# Patient Record
Sex: Female | Born: 1965 | Race: White | Hispanic: No | Marital: Married | Smoking: Never smoker
Health system: Southern US, Community
[De-identification: ages and names within clinical notes are randomized; demographics above are authoritative.]

## PROBLEM LIST (undated history)

## (undated) DIAGNOSIS — G43909 Migraine, unspecified, not intractable, without status migrainosus: Secondary | ICD-10-CM

## (undated) DIAGNOSIS — K8050 Calculus of bile duct without cholangitis or cholecystitis without obstruction: Secondary | ICD-10-CM

## (undated) HISTORY — DX: Migraine, unspecified, not intractable, without status migrainosus: G43.909

---

## 1998-04-15 ENCOUNTER — Other Ambulatory Visit: Admission: RE | Admit: 1998-04-15 | Discharge: 1998-04-15 | Payer: Self-pay | Admitting: Obstetrics and Gynecology

## 1998-07-04 ENCOUNTER — Other Ambulatory Visit
Admission: RE | Admit: 1998-07-04 | Discharge: 1998-07-04 | Payer: Self-pay | Admitting: Physical Medicine & Rehabilitation

## 1999-04-05 ENCOUNTER — Other Ambulatory Visit: Admission: RE | Admit: 1999-04-05 | Discharge: 1999-04-05 | Payer: Self-pay | Admitting: Obstetrics and Gynecology

## 1999-05-26 ENCOUNTER — Inpatient Hospital Stay (HOSPITAL_COMMUNITY): Admission: AD | Admit: 1999-05-26 | Discharge: 1999-05-26 | Payer: Self-pay | Admitting: Obstetrics & Gynecology

## 1999-06-12 ENCOUNTER — Ambulatory Visit (HOSPITAL_COMMUNITY): Admission: RE | Admit: 1999-06-12 | Discharge: 1999-06-12 | Payer: Self-pay | Admitting: Obstetrics and Gynecology

## 1999-06-19 ENCOUNTER — Encounter: Admission: RE | Admit: 1999-06-19 | Discharge: 1999-08-13 | Payer: Self-pay | Admitting: Obstetrics and Gynecology

## 1999-06-30 ENCOUNTER — Encounter: Admission: RE | Admit: 1999-06-30 | Discharge: 1999-08-18 | Payer: Self-pay | Admitting: Obstetrics and Gynecology

## 1999-07-17 ENCOUNTER — Inpatient Hospital Stay (HOSPITAL_COMMUNITY): Admission: AD | Admit: 1999-07-17 | Discharge: 1999-07-17 | Payer: Self-pay | Admitting: Obstetrics and Gynecology

## 1999-08-15 ENCOUNTER — Inpatient Hospital Stay (HOSPITAL_COMMUNITY): Admission: AD | Admit: 1999-08-15 | Discharge: 1999-08-21 | Payer: Self-pay | Admitting: *Deleted

## 1999-08-15 ENCOUNTER — Encounter (INDEPENDENT_AMBULATORY_CARE_PROVIDER_SITE_OTHER): Payer: Self-pay

## 1999-08-22 ENCOUNTER — Encounter: Admission: RE | Admit: 1999-08-22 | Discharge: 1999-11-20 | Payer: Self-pay | Admitting: Obstetrics and Gynecology

## 1999-09-23 ENCOUNTER — Other Ambulatory Visit: Admission: RE | Admit: 1999-09-23 | Discharge: 1999-09-23 | Payer: Self-pay | Admitting: Obstetrics and Gynecology

## 1999-11-21 ENCOUNTER — Encounter: Admission: RE | Admit: 1999-11-21 | Discharge: 2000-02-19 | Payer: Self-pay | Admitting: Obstetrics and Gynecology

## 1999-12-24 ENCOUNTER — Encounter: Admission: RE | Admit: 1999-12-24 | Discharge: 1999-12-24 | Payer: Self-pay | Admitting: Obstetrics and Gynecology

## 1999-12-24 ENCOUNTER — Encounter: Payer: Self-pay | Admitting: Obstetrics and Gynecology

## 2000-02-21 ENCOUNTER — Encounter: Admission: RE | Admit: 2000-02-21 | Discharge: 2000-05-19 | Payer: Self-pay | Admitting: Obstetrics and Gynecology

## 2000-09-15 ENCOUNTER — Other Ambulatory Visit: Admission: RE | Admit: 2000-09-15 | Discharge: 2000-09-15 | Payer: Self-pay | Admitting: Obstetrics and Gynecology

## 2001-08-23 ENCOUNTER — Ambulatory Visit (HOSPITAL_COMMUNITY): Admission: RE | Admit: 2001-08-23 | Discharge: 2001-08-23 | Payer: Self-pay | Admitting: Obstetrics and Gynecology

## 2001-11-08 ENCOUNTER — Inpatient Hospital Stay (HOSPITAL_COMMUNITY): Admission: AD | Admit: 2001-11-08 | Discharge: 2001-11-11 | Payer: Self-pay | Admitting: Obstetrics and Gynecology

## 2001-11-12 ENCOUNTER — Encounter: Admission: RE | Admit: 2001-11-12 | Discharge: 2001-12-12 | Payer: Self-pay | Admitting: Obstetrics and Gynecology

## 2001-11-12 ENCOUNTER — Inpatient Hospital Stay (HOSPITAL_COMMUNITY): Admission: AD | Admit: 2001-11-12 | Discharge: 2001-11-12 | Payer: Self-pay | Admitting: Obstetrics and Gynecology

## 2001-12-20 ENCOUNTER — Other Ambulatory Visit: Admission: RE | Admit: 2001-12-20 | Discharge: 2001-12-20 | Payer: Self-pay | Admitting: Obstetrics and Gynecology

## 2001-12-26 ENCOUNTER — Encounter: Admission: RE | Admit: 2001-12-26 | Discharge: 2002-01-25 | Payer: Self-pay | Admitting: Obstetrics and Gynecology

## 2002-02-12 ENCOUNTER — Encounter: Admission: RE | Admit: 2002-02-12 | Discharge: 2002-03-14 | Payer: Self-pay | Admitting: Obstetrics and Gynecology

## 2002-04-14 ENCOUNTER — Encounter: Admission: RE | Admit: 2002-04-14 | Discharge: 2002-05-14 | Payer: Self-pay | Admitting: Obstetrics and Gynecology

## 2002-06-14 ENCOUNTER — Encounter: Admission: RE | Admit: 2002-06-14 | Discharge: 2002-07-14 | Payer: Self-pay | Admitting: Obstetrics and Gynecology

## 2002-07-15 ENCOUNTER — Encounter: Admission: RE | Admit: 2002-07-15 | Discharge: 2002-08-14 | Payer: Self-pay | Admitting: Obstetrics and Gynecology

## 2002-09-13 ENCOUNTER — Encounter: Admission: RE | Admit: 2002-09-13 | Discharge: 2002-10-13 | Payer: Self-pay | Admitting: Obstetrics and Gynecology

## 2003-01-02 ENCOUNTER — Other Ambulatory Visit: Admission: RE | Admit: 2003-01-02 | Discharge: 2003-01-02 | Payer: Self-pay | Admitting: Obstetrics and Gynecology

## 2004-01-28 ENCOUNTER — Other Ambulatory Visit: Admission: RE | Admit: 2004-01-28 | Discharge: 2004-01-28 | Payer: Self-pay | Admitting: Obstetrics and Gynecology

## 2005-02-13 ENCOUNTER — Other Ambulatory Visit: Admission: RE | Admit: 2005-02-13 | Discharge: 2005-02-13 | Payer: Self-pay | Admitting: Obstetrics and Gynecology

## 2007-10-27 ENCOUNTER — Inpatient Hospital Stay (HOSPITAL_COMMUNITY): Admission: AD | Admit: 2007-10-27 | Discharge: 2007-10-27 | Payer: Self-pay | Admitting: Obstetrics and Gynecology

## 2008-11-26 ENCOUNTER — Inpatient Hospital Stay (HOSPITAL_COMMUNITY): Admission: RE | Admit: 2008-11-26 | Discharge: 2008-11-28 | Payer: Self-pay | Admitting: Obstetrics and Gynecology

## 2010-08-25 LAB — CBC
HCT: 29.8 % — ABNORMAL LOW (ref 36.0–46.0)
HCT: 34.9 % — ABNORMAL LOW (ref 36.0–46.0)
Hemoglobin: 10.4 g/dL — ABNORMAL LOW (ref 12.0–15.0)
Hemoglobin: 12.2 g/dL (ref 12.0–15.0)
MCHC: 34.9 g/dL (ref 30.0–36.0)
MCHC: 35 g/dL (ref 30.0–36.0)
MCV: 96.1 fL (ref 78.0–100.0)
MCV: 97.9 fL (ref 78.0–100.0)
Platelets: 186 10*3/uL (ref 150–400)
Platelets: 215 10*3/uL (ref 150–400)
RBC: 3.04 MIL/uL — ABNORMAL LOW (ref 3.87–5.11)
RBC: 3.63 MIL/uL — ABNORMAL LOW (ref 3.87–5.11)
RDW: 13.6 % (ref 11.5–15.5)
RDW: 13.8 % (ref 11.5–15.5)
WBC: 11 10*3/uL — ABNORMAL HIGH (ref 4.0–10.5)
WBC: 17.9 10*3/uL — ABNORMAL HIGH (ref 4.0–10.5)

## 2010-08-25 LAB — RH IMMUNE GLOB WKUP(>/=20WKS)(NOT WOMEN'S HOSP): Fetal Screen: NEGATIVE

## 2010-08-25 LAB — RPR: RPR Ser Ql: NONREACTIVE

## 2010-10-03 NOTE — Discharge Summary (Signed)
[facility]  Patient:    Erin Weaver, Erin Weaver                      MRN: [id number] Adm. Date:  [id number] Disc. Date: [id number] Attending:  Donne Hazel Dictator:   Danie Chandler, R.N.                           Discharge Summary  ADMISSION DIAGNOSES:          1. Intrauterine pregnancy at term.                               2. Twin gestation.                               3. Vertex/vertex presentation.                               4. Spontaneous rupture of membranes.  DISCHARGE DIAGNOSES:          1. Intrauterine pregnancy at term.                               2. Twin gestation.                               3. Vertex/vertex presentation.                               4. Spontaneous rupture of membranes.                               5. Cephalopelvic disproportion.                               6. Anemia.  PROCEDURE:                    On August 17, 1999, primary low transverse cesarean  section.  REASON FOR ADMISSION:         The patient is a 45 year old married white female, gravida 1, para 0, at 37-1/7 weeks twin gestation.  The patient was admitted following spontaneous rupture of membranes.  Her twin pregnancy has gone well.  Twins were noted to be vertex/vertex on ultrasound recently.  The patient received Pitocin augmentation and did progress throughout the day.  The patient had achieved dilatation at 9 cm, but failed to progress beyond that point over four to five hours despite Pitocin augmentation with reasonable labor pattern consistent with CPD.  HOSPITAL COURSE:              The patient was taken to the operating room and underwent the above named procedure without complications.  This was productive of twin A a viable female infant with Apgars of 8 at one minute and 8 at five minutes and twin B a viable female infant with Apgars of 8 at one minute and 8 at five minutes.  Postoperatively on day #1, the patients hemoglobin was  8.4, hematocrit 24.5,  and white blood cell count 17.1.  The patient was started on iron daily and had a repeat CBC ordered.  She had good return of bowel function on this day. n postoperative day #2, the patient was complaining of some incisional soreness. Her vital signs were stable.  Her hemoglobin was 6.8 and hematocrit 20.0, and white  blood cell count 9.3.  The patient was continued on iron daily and had a repeat CBC ordered for the following day.  The patient was having some dizziness when ambulating in room and continued to have difficulty with pain control.  She received Motrin on a scheduled basis this day and on postoperative day #4, the patient was tolerating a regular diet and ambulating better in the halls.  She lso had better pain control.  Her hemoglobin was stable at 7.5 and she was discharged home on postoperative day #4.  CONDITION ON DISCHARGE:       Good.  DIET:                         Regular as tolerated.  ACTIVITY:                     No heavy lifting, no driving, and no vaginal entry.  FOLLOW-UP:                    She is to follow up in the office in one to two weeks for incision check and she is to call for a temperature greater than 100 degrees, persistent nausea and vomiting, heavy vaginal bleeding, and/or redness and drainage from the incision site.  DISCHARGE MEDICATIONS:        1. Prenatal vitamins one p.o. q.d.                               2. Nu-Iron 150 mg one p.o. b.i.d.                               3. Darvocet-N 100 #30 as directed by M.D. no refill.  DD:  09/08/99 TD:  09/08/99 Job: [id number]

## 2010-10-03 NOTE — Discharge Summary (Signed)
[place]. [facility]  Patient:    Erin Weaver, Erin Weaver                      MRN: [id number] Adm. Date:  [id number] Disc. Date: [id number] Attending:  Donne Hazel                           Discharge Summary  NO DICTATION DD:  08/21/99 TD:  08/23/99 Job: [id number]

## 2010-10-03 NOTE — Op Note (Signed)
[facility] of [facility]  Patient:    Erin Weaver, Erin Weaver                      MRN: [id number] Proc. Date: 08/17/99 Adm. Date:  [id number] Attending:  Donne Hazel                           Operative Report  PREOPERATIVE DIAGNOSIS:       Term twin gestation.  Cephalopelvic disproportion.  POSTOPERATIVE DIAGNOSIS:      Term twin gestation.  Cephalopelvic disproportion.  OPERATION:                    Primary low transverse cesarean section.  SURGEON:                      Duke Salvia. Marcelle Overlie, M.D.  ASSISTANT:  ANESTHESIA:                   Epidural.  COMPLICATIONS:                None.  DRAINS:                       Foley catheter.  ESTIMATED BLOOD LOSS:         1000 cc.  DESCRIPTION OF PROCEDURE:     This patient had achieved dilatation at 9 cm, but  failed to progress beyond that point over four to five hours despite Pitocin augmentation with reasonable labor pattern consistent with CPD.  The patient was taken to the operating room and after an adequate level of epidural anesthesia was obtained with the patient in the left tilt position with the abdomen prepped and draped in the usual fashion for sterile abdominal procedure, Foley catheter was positioned draining clear urine.  A transverse Pfannenstiel incision was made two fingerbreadths above the symphysis and carried down to the fascia which was incised and extended transversely.  The rectus muscles were divided in the midline.  The peritoneum was entered superiorly without incident and extended in a vertical manner.  The vesicouterine serosa was then incised and the bladder was bluntly and sharply dissected off of the lower uterine segment and the bladder blade was positioned.  A transverse incision was made in the lower segment and extended with blunt dissection.  Twin A was delivered in an OP presentation after rotating the vertex to effect easier delivery.  A 5 pound 3 ounce  female, Apgars 8 and 8.  A separate sac of twin B in the vertex presentation, 5 pound 3 ounce female Apgars 8 and 8.  Both infants in excellent condition and passed to the pediatric team.  The placenta delivered manually intact and sent to pathology.  The uterus was exteriorized.  The cavity was wiped clean with a laparotomy pack.  Closure as obtained in the first layer of 0 chromic in a locked fashion followed by an imbricating layer of 0 chromic.  During this time, IV Pitocin, massage, and 10 units of Pitocin into the myometrium was given after negative aspiration with steady firming of the myometrium.  Tubes and ovaries were normal.  Prior to closure, sponge, needle, and instrument counts were correct x 2.  The rectus muscles were then reapproximated with a 3-0 Dexon running suture.  The fascia was closed from laterally to midline on either side with a running 0 Dexon suture.  Subcutaneous fat was irrigated and noted to be hemostatic.  Clips and Steri-Strips were used on the skin.  She tolerated this well and went to the recovery room in good condition. DD:  08/17/99 TD:  08/17/99 Job: [id number]

## 2010-10-03 NOTE — Discharge Summary (Signed)
[place]. [facility]  Patient:    Erin Weaver, Erin Weaver                      MRN: [id number] Adm. Date:  [id number] Disc. Date: [id number] Attending:  Donne Hazel                           Discharge Summary  NO DICTATION DD:  08/21/99 TD:  08/23/99 Job: [id number]

## 2011-02-12 LAB — RH IMMUNE GLOBULIN WORKUP (NOT WOMEN'S HOSP)

## 2011-08-27 ENCOUNTER — Encounter: Payer: Self-pay | Admitting: Family Medicine

## 2011-08-27 ENCOUNTER — Ambulatory Visit (INDEPENDENT_AMBULATORY_CARE_PROVIDER_SITE_OTHER): Payer: BC Managed Care – PPO | Admitting: Family Medicine

## 2011-08-27 VITALS — BP 92/60 | HR 60 | Ht 62.0 in | Wt 115.0 lb

## 2011-08-27 DIAGNOSIS — H579 Unspecified disorder of eye and adnexa: Secondary | ICD-10-CM

## 2011-08-27 DIAGNOSIS — H5789 Other specified disorders of eye and adnexa: Secondary | ICD-10-CM

## 2011-08-27 NOTE — Patient Instructions (Signed)
Your eye irritation doesn't appear to be due to bacterial or viral conjunctivitis.  The whites of the eyes aren't really red.  I think that you have ongoing irritation to the eye.  I recommend a trial of a lubricating eye drop (ie refresh or natural tears), or you can try an antihistamine eye drop (which would also treat any possible underlying allergies) such as Naphcon A or Visine A, or others (you can check with the pharmacist regarding other OTC drops).  If your eye becomes red, crusting becomes worse and discolored, call me for a prescription antibiotic eye drop.  If eye symptoms aren't worsening, but aren't improving, despite using the above-mentioned OTC drops, then call for a prescription to try an anti-inflammatory drop.  Wash hands frequently, avoid rubbing or touching eyes.  Conjunctivitis Conjunctivitis is commonly called "pink eye." Conjunctivitis can be caused by bacterial or viral infection, allergies, or injuries. There is usually redness of the lining of the eye, itching, discomfort, and sometimes discharge. There may be deposits of matter along the eyelids. A viral infection usually causes a watery discharge, while a bacterial infection causes a yellowish, thick discharge. Pink eye is very contagious and spreads by direct contact. You may be given antibiotic eyedrops as part of your treatment. Before using your eye medicine, remove all drainage from the eye by washing gently with warm water and cotton balls. Continue to use the medication until you have awakened 2 mornings in a row without discharge from the eye. Do not rub your eye. This increases the irritation and helps spread infection. Use separate towels from other household members. Wash your hands with soap and water before and after touching your eyes. Use cold compresses to reduce pain and sunglasses to relieve irritation from light. Do not wear contact lenses or wear eye makeup until the infection is gone. SEEK MEDICAL CARE  IF:   Your symptoms are not better after 3 days of treatment.   You have increased pain or trouble seeing.   The outer eyelids become very red or swollen.  Document Released: 06/11/2004 Document Revised: 04/23/2011 Document Reviewed: 05/04/2005 [facility] Patient Information 2012 [place], [place].

## 2011-08-27 NOTE — Progress Notes (Signed)
Right eye was noted to be red, goopy and slightly itchy 4 days ago.  Feels somewhat irritated.  No problems with left eye.  Eyes have been crusted in the mornings all week, but last night it got goopy while just sitting around and reading.  Denies any other sick contacts. Denies any trauma or injury to the eye. Denies light sensitivity or vision loss.  R eye is watering more.  Denies sniffling, sneezing, runny nose.  Sometimes has some intermittent nasal congestion at night, but not recently, and doesn't have history of allergies.  Past Medical History  Diagnosis Date  . Migraines     Past Surgical History  Procedure Date  . Cesarean section     History   Social History  . Marital Status: Married    Spouse Name: Quetzally Callas    Number of Children: 4  . Years of Education: N/A   Occupational History  . attorney; currently homemaker    Social History Main Topics  . Smoking status: Never Smoker   . Smokeless tobacco: Never Used  . Alcohol Use: Yes     2-3 drinks per week.  . Drug Use: No  . Sexually Active: Yes -- Female partner(s)    Birth Control/ Protection: Surgical     husband with vasectomy   Other Topics Concern  . Not on file   Social History Narrative   Lives with husband, twin girls ([place] and [place]), [place], and [place]. 1 dog (Daisy)    Family History  Problem Relation Age of Onset  . Diabetes Mother   . Hypertension Mother   . Heart disease Father 73    MI  . Diabetes Father   . Hypertension Father   . Cancer Father     skin  . Sjogren's syndrome Sister   . Cancer Sister     skin  . Migraines Sister   . Cancer Paternal Aunt     ovarian cancer  . Depression Sister   . Irritable bowel syndrome Sister   . Migraines Sister   . Migraines Sister   . Migraines Sister     Current outpatient prescriptions:ibuprofen (ADVIL,MOTRIN) 200 MG tablet, Take 200 mg by mouth every 6 (six) hours as needed., Disp: , Rfl:   Allergies  Allergen Reactions  .  Erythromycin Rash  . Sulfa Antibiotics Rash   ROS:  Denies fevers, URI symptoms, sore throat, cough, chest pain, GI complaints, skin concerns.  Migraines are infrequent.    PHYSICAL EXAM: BP 92/60  Pulse 60  Ht 5\' 2"  (1.575 m)  Wt 115 lb (52.164 kg)  BMI 21.03 kg/m2  LMP 08/11/2011 Well developed, pleasant female, accompanied by her very busy 44 year old daughter HEENT:  PERRL, conjunctiva is only minimally injected, at medial portion of left eye.  Remainder of conjunctiva is clear.  There is just slight dried crust at skin on face just lateral to eye,  No purulence is noted. TM's and EAC's normal bilaterally.  OP is clear.  Nasal mucosa is normal. Neck: no lymphadenopathy or thyromegaly Heart: regular rate and rhythm without murmur Lungs: clear bilaterally Back: nontender Abdomen: soft, nontender Extremities: no edema, normal pulses Skin: no rashes Psych: normal mood, affect, hygiene and grooming  ASSESSMENT/PLAN: 1. Irritation of right eye    L eye irritation, watering, slight crusting-- doesn't seem consistent with viral or bacterial conjunctivitis at this point.  Consider allergies, although would expect symptoms to be bilaterally.   Recommended treatment with either lubricating eye drops (  ie Refresh or Natural Tears), vs OTC antihistamine eye drops (Nafcon A or others).  If persistent irritation, discomfort, but not worsening/purulence, try topical NSAID drops. If worsening symptoms, call in polytrim drops.

## 2011-09-09 ENCOUNTER — Encounter: Payer: Self-pay | Admitting: *Deleted

## 2012-05-19 ENCOUNTER — Ambulatory Visit: Payer: BC Managed Care – PPO | Admitting: Family Medicine

## 2012-06-16 ENCOUNTER — Encounter: Payer: Self-pay | Admitting: Family Medicine

## 2012-06-16 ENCOUNTER — Ambulatory Visit (INDEPENDENT_AMBULATORY_CARE_PROVIDER_SITE_OTHER): Payer: BC Managed Care – PPO | Admitting: Family Medicine

## 2012-06-16 VITALS — BP 102/70 | HR 72 | Temp 100.2°F | Ht 62.0 in | Wt 112.0 lb

## 2012-06-16 DIAGNOSIS — R509 Fever, unspecified: Secondary | ICD-10-CM

## 2012-06-16 DIAGNOSIS — B9789 Other viral agents as the cause of diseases classified elsewhere: Secondary | ICD-10-CM

## 2012-06-16 DIAGNOSIS — B349 Viral infection, unspecified: Secondary | ICD-10-CM

## 2012-06-16 DIAGNOSIS — R05 Cough: Secondary | ICD-10-CM

## 2012-06-16 LAB — POCT INFLUENZA A/B
Influenza A, POC: NEGATIVE
Influenza B, POC: NEGATIVE

## 2012-06-16 MED ORDER — HYDROCOD POLST-CHLORPHEN POLST 10-8 MG/5ML PO LQCR: 5.0000 mL | Freq: Every evening | ORAL | Status: DC | PRN

## 2012-06-16 MED ORDER — BENZONATATE 200 MG PO CAPS: 200.0000 mg | ORAL_CAPSULE | Freq: Three times a day (TID) | ORAL | Status: DC | PRN

## 2012-06-16 NOTE — Patient Instructions (Addendum)
Continue tylenol and/or ibuprofen as needed for fever and pain.  Take the cough syrup in the evening--it lasts for 12 hours and can be sedating due to the hydrocodone.  Do not take PM medications along with it.  During the day, take the tessalon--this shouldn't make you sleepy, and can help with the cough.  Continue the mucinex--to keep the phlegm thin, in case there is something that needs to be loosened up.    Call me if your fevers recur, you start coughing up or blowing out discolored mucus/phlegm.  Let me know if you develop shortness of breath or wheezing.  Try and drink plenty of fluids, get plenty of rest

## 2012-06-16 NOTE — Progress Notes (Signed)
Chief Complaint  Patient presents with  . Fever    and cough that began Monday. Extreme fatigue.   HPI:  Started with skin feeling sensitive 4 nights ago, but went skiing the next day and felt okay.  The next night it was worse, wanted nothing to touch her.  3 days ago started with extreme fatigue, fever 102.  Fevers haven't been as high since Tuesday night, fevers lower yesterday and today.  Cough started Monday night, nonproductive.  Has spasms of coughing, feels like she needs to get phlegm up, but nothing is there.  Spasms are so severe, she feels like she is "coughing up a lung".  Hasn't gotten any sleep.  Cough is worse when supine.  Denies tightness or shortness of breath.  Feels raw in the throat from coughing.  Decreased appetite.  No nausea. Vomiting, diarrhea.  No head congestion, sneezing or sinus pain.  She has tried multiple OTC meds including Mucinex chest congestion, mucinex DM, Nyquil, motrin, motrin PM, OTC honey homeopathic med.  Drinking a lot of tea with honey, cough lozenge.  Nothing has helped.  +sick contacts--children sick with colds last week.  No known flu exposure.  Had flu shot this year.  Past Medical History  Diagnosis Date  . Migraines    Past Surgical History  Procedure Date  . Cesarean section    History   Social History  . Marital Status: Married    Spouse Name: England Greb    Number of Children: 4  . Years of Education: N/A   Occupational History  . attorney; currently homemaker    Social History Main Topics  . Smoking status: Never Smoker   . Smokeless tobacco: Never Used  . Alcohol Use: Yes     Comment: 2-3 drinks per week.  . Drug Use: No  . Sexually Active: Yes -- Female partner(s)    Birth Control/ Protection: Surgical     Comment: husband with vasectomy   Other Topics Concern  . Not on file   Social History Narrative   Lives with husband, twin girls ([place] and [place]), [place], and [place]. 1 dog (Daisy)   Current Outpatient  Prescriptions on File Prior to Visit  Medication Sig Dispense Refill  . ibuprofen (ADVIL,MOTRIN) 200 MG tablet Take 200 mg by mouth every 6 (six) hours as needed.       Allergies  Allergen Reactions  . Erythromycin Rash  . Sulfa Antibiotics Rash   ROS: Denies headaches (except from coughing), dizziness, chest pain, palpitations. No ear pain.  No skin rash, bleeding, bruising.  Denies shortness of breath.  No nausea, vomiting, diarrhea.  No sinus congestion/pain or allergy symptoms.  See HPI  PHYSICAL EXAM: BP 102/70  Pulse 72  Temp 100.2 F (37.9 C) (Oral)  Ht 5\' 2"  (1.575 m)  Wt 112 lb (50.803 kg)  BMI 20.49 kg/m2  LMP 06/05/2012 Spasms of dry cough.  Voice is hoarse.  Able to speak easily in full sentences (in slightly hoarse voice) between coughing spells HEENT:  PERRL, EOMI, conjunctiva clear.  TM's and EAC's normal.  OP normal.  Nasal mucosa minimally edematous, no purulence or erythema.  Sinuses nontender Neck: no lymphadenopathy, thyromegaly or mass Heart: regular rate and rhythm without murmur Lungs: clear bilaterally.  Good air movement.  No wheezes, rales, ronchi.  No cough or wheezing with forced expiration Skin: no rash Psych: normal mood, affect, hygiene and grooming  ASSESSMENT/PLAN:  1. Cough  Influenza A/B, benzonatate (TESSALON) 200 MG capsule,  chlorpheniramine-HYDROcodone (TUSSIONEX PENNKINETIC ER) 10-8 MG/5ML LQCR  2. Fever  Influenza A/B  3. Viral syndrome     Reviewed risks/side effects of tussionex and tessalon.  She has not done well with vicodin in past--reviewed that hydrocodone is also in the cough syrup, but just to be taken at night.  Use the tessalon during the day.  Call or return if persistent/worsening fever, cough, shortness of breath, develops discolored phlegm/mucus or other symptoms

## 2012-10-03 ENCOUNTER — Other Ambulatory Visit: Payer: Self-pay

## 2012-10-03 DIAGNOSIS — Z1231 Encounter for screening mammogram for malignant neoplasm of breast: Secondary | ICD-10-CM

## 2012-10-18 ENCOUNTER — Ambulatory Visit
Admission: RE | Admit: 2012-10-18 | Discharge: 2012-10-18 | Disposition: A | Discharge status: Home / Self Care | Payer: BC Managed Care – PPO | Source: Ambulatory Visit

## 2012-10-18 DIAGNOSIS — Z1231 Encounter for screening mammogram for malignant neoplasm of breast: Secondary | ICD-10-CM

## 2013-04-05 ENCOUNTER — Encounter: Payer: Self-pay | Admitting: Family Medicine

## 2013-09-26 ENCOUNTER — Other Ambulatory Visit: Payer: Self-pay

## 2013-09-26 DIAGNOSIS — Z1231 Encounter for screening mammogram for malignant neoplasm of breast: Secondary | ICD-10-CM

## 2013-10-24 ENCOUNTER — Ambulatory Visit: Payer: BC Managed Care – PPO

## 2013-10-31 ENCOUNTER — Ambulatory Visit
Admission: RE | Admit: 2013-10-31 | Discharge: 2013-10-31 | Disposition: A | Discharge status: Home / Self Care | Payer: 59 | Source: Ambulatory Visit

## 2013-10-31 DIAGNOSIS — Z1231 Encounter for screening mammogram for malignant neoplasm of breast: Secondary | ICD-10-CM

## 2014-09-24 ENCOUNTER — Other Ambulatory Visit: Payer: Self-pay

## 2014-09-24 DIAGNOSIS — Z1231 Encounter for screening mammogram for malignant neoplasm of breast: Secondary | ICD-10-CM

## 2014-11-02 ENCOUNTER — Ambulatory Visit
Admission: RE | Admit: 2014-11-02 | Discharge: 2014-11-02 | Disposition: A | Discharge status: Home / Self Care | Payer: 59 | Source: Ambulatory Visit

## 2014-11-02 DIAGNOSIS — Z1231 Encounter for screening mammogram for malignant neoplasm of breast: Secondary | ICD-10-CM

## 2015-10-01 ENCOUNTER — Other Ambulatory Visit: Payer: Self-pay

## 2015-10-01 DIAGNOSIS — Z1231 Encounter for screening mammogram for malignant neoplasm of breast: Secondary | ICD-10-CM

## 2015-11-04 ENCOUNTER — Ambulatory Visit
Admission: RE | Admit: 2015-11-04 | Discharge: 2015-11-04 | Disposition: A | Discharge status: Home / Self Care | Payer: 59 | Source: Ambulatory Visit

## 2015-11-04 DIAGNOSIS — Z1231 Encounter for screening mammogram for malignant neoplasm of breast: Secondary | ICD-10-CM

## 2016-03-23 ENCOUNTER — Ambulatory Visit
Admission: RE | Admit: 2016-03-23 | Discharge: 2016-03-23 | Disposition: A | Discharge status: Home / Self Care | Payer: 59 | Source: Ambulatory Visit | Attending: Family Medicine | Admitting: Family Medicine

## 2016-03-23 ENCOUNTER — Inpatient Hospital Stay (HOSPITAL_COMMUNITY)
Admission: EM | Admit: 2016-03-23 | Discharge: 2016-03-27 | DRG: 419 | Disposition: A | Discharge status: Home / Self Care | Payer: 59 | Attending: Internal Medicine | Admitting: Internal Medicine

## 2016-03-23 ENCOUNTER — Ambulatory Visit (INDEPENDENT_AMBULATORY_CARE_PROVIDER_SITE_OTHER): Payer: 59 | Admitting: Family Medicine

## 2016-03-23 ENCOUNTER — Encounter (HOSPITAL_COMMUNITY): Payer: Self-pay

## 2016-03-23 ENCOUNTER — Encounter: Payer: Self-pay | Admitting: Family Medicine

## 2016-03-23 ENCOUNTER — Other Ambulatory Visit: Payer: Self-pay | Admitting: *Deleted

## 2016-03-23 VITALS — BP 118/76 | HR 64 | Temp 99.0°F | Ht 62.0 in | Wt 111.2 lb

## 2016-03-23 DIAGNOSIS — R945 Abnormal results of liver function studies: Secondary | ICD-10-CM

## 2016-03-23 DIAGNOSIS — R1013 Epigastric pain: Secondary | ICD-10-CM | POA: Diagnosis present

## 2016-03-23 DIAGNOSIS — G43909 Migraine, unspecified, not intractable, without status migrainosus: Secondary | ICD-10-CM | POA: Diagnosis present

## 2016-03-23 DIAGNOSIS — R740 Nonspecific elevation of levels of transaminase and lactic acid dehydrogenase [LDH]: Secondary | ICD-10-CM | POA: Diagnosis present

## 2016-03-23 DIAGNOSIS — K8067 Calculus of gallbladder and bile duct with acute and chronic cholecystitis with obstruction: Secondary | ICD-10-CM | POA: Diagnosis present

## 2016-03-23 DIAGNOSIS — Z833 Family history of diabetes mellitus: Secondary | ICD-10-CM

## 2016-03-23 DIAGNOSIS — K8071 Calculus of gallbladder and bile duct without cholecystitis with obstruction: Secondary | ICD-10-CM

## 2016-03-23 DIAGNOSIS — Z818 Family history of other mental and behavioral disorders: Secondary | ICD-10-CM | POA: Diagnosis not present

## 2016-03-23 DIAGNOSIS — Z79899 Other long term (current) drug therapy: Secondary | ICD-10-CM

## 2016-03-23 DIAGNOSIS — F329 Major depressive disorder, single episode, unspecified: Secondary | ICD-10-CM | POA: Diagnosis present

## 2016-03-23 DIAGNOSIS — K831 Obstruction of bile duct: Secondary | ICD-10-CM | POA: Diagnosis not present

## 2016-03-23 DIAGNOSIS — K8050 Calculus of bile duct without cholangitis or cholecystitis without obstruction: Secondary | ICD-10-CM

## 2016-03-23 DIAGNOSIS — R7989 Other specified abnormal findings of blood chemistry: Secondary | ICD-10-CM

## 2016-03-23 DIAGNOSIS — Z882 Allergy status to sulfonamides status: Secondary | ICD-10-CM

## 2016-03-23 DIAGNOSIS — Z8379 Family history of other diseases of the digestive system: Secondary | ICD-10-CM | POA: Diagnosis not present

## 2016-03-23 DIAGNOSIS — Z8249 Family history of ischemic heart disease and other diseases of the circulatory system: Secondary | ICD-10-CM | POA: Diagnosis not present

## 2016-03-23 DIAGNOSIS — Z8041 Family history of malignant neoplasm of ovary: Secondary | ICD-10-CM | POA: Diagnosis not present

## 2016-03-23 DIAGNOSIS — Z881 Allergy status to other antibiotic agents status: Secondary | ICD-10-CM

## 2016-03-23 DIAGNOSIS — K660 Peritoneal adhesions (postprocedural) (postinfection): Secondary | ICD-10-CM | POA: Diagnosis present

## 2016-03-23 DIAGNOSIS — R112 Nausea with vomiting, unspecified: Secondary | ICD-10-CM

## 2016-03-23 DIAGNOSIS — K812 Acute cholecystitis with chronic cholecystitis: Secondary | ICD-10-CM | POA: Diagnosis present

## 2016-03-23 DIAGNOSIS — Z419 Encounter for procedure for purposes other than remedying health state, unspecified: Secondary | ICD-10-CM

## 2016-03-23 HISTORY — DX: Calculus of bile duct without cholangitis or cholecystitis without obstruction: K80.50

## 2016-03-23 LAB — CBC WITH DIFFERENTIAL/PLATELET
BASOS ABS: 40 {cells}/uL (ref 0–200)
Basophils Relative: 1 %
EOS ABS: 80 {cells}/uL (ref 15–500)
EOS PCT: 2 %
HCT: 37.3 % (ref 35.0–45.0)
HEMOGLOBIN: 13.1 g/dL (ref 11.7–15.5)
Lymphocytes Relative: 33 %
Lymphs Abs: 1320 cells/uL (ref 850–3900)
MCH: 31.6 pg (ref 27.0–33.0)
MCHC: 35.1 g/dL (ref 32.0–36.0)
MCV: 90.1 fL (ref 80.0–100.0)
MONOS PCT: 8 %
MPV: 10 fL (ref 7.5–12.5)
Monocytes Absolute: 320 cells/uL (ref 200–950)
NEUTROS ABS: 2240 {cells}/uL (ref 1500–7800)
NEUTROS PCT: 56 %
PLATELETS: 246 10*3/uL (ref 140–400)
RBC: 4.14 MIL/uL (ref 3.80–5.10)
RDW: 12.4 % (ref 11.0–15.0)
WBC: 4 10*3/uL (ref 4.0–10.5)

## 2016-03-23 LAB — HEPATIC FUNCTION PANEL
ALBUMIN: 4.5 g/dL (ref 3.6–5.1)
ALK PHOS: 170 U/L — AB (ref 33–115)
ALT: 430 U/L — ABNORMAL HIGH (ref 6–29)
AST: 257 U/L — AB (ref 10–35)
Bilirubin, Direct: 2.8 mg/dL — ABNORMAL HIGH (ref <=0.2)
Indirect Bilirubin: 1.9 mg/dL — ABNORMAL HIGH (ref 0.2–1.2)
TOTAL PROTEIN: 7.3 g/dL (ref 6.1–8.1)
Total Bilirubin: 4.7 mg/dL — ABNORMAL HIGH (ref 0.2–1.2)

## 2016-03-23 LAB — HEPATITIS PANEL, ACUTE
HCV Ab: NEGATIVE
HEP B S AG: NEGATIVE
Hep A IgM: NONREACTIVE
Hep B C IgM: NONREACTIVE

## 2016-03-23 MED ORDER — DIPHENHYDRAMINE HCL 12.5 MG/5ML PO ELIX: 12.5000 mg | ORAL_SOLUTION | Freq: Four times a day (QID) | ORAL | Status: DC | PRN

## 2016-03-23 MED ORDER — ACETAMINOPHEN 650 MG RE SUPP: 650.0000 mg | Freq: Four times a day (QID) | RECTAL | Status: DC | PRN

## 2016-03-23 MED ORDER — ALUM & MAG HYDROXIDE-SIMETH 200-200-20 MG/5ML PO SUSP: 30.0000 mL | Freq: Four times a day (QID) | ORAL | Status: DC | PRN

## 2016-03-23 MED ORDER — ONDANSETRON 4 MG PO TBDP: 4.0000 mg | ORAL_TABLET | Freq: Four times a day (QID) | ORAL | Status: DC | PRN

## 2016-03-23 MED ORDER — IBUPROFEN 400 MG PO TABS
400.0000 mg | ORAL_TABLET | Freq: Four times a day (QID) | ORAL | Status: DC | PRN
  Administered 2016-03-27: 400 mg via ORAL
  Filled 2016-03-23: qty 1

## 2016-03-23 MED ORDER — LACTATED RINGERS IV BOLUS (SEPSIS)
1000.0000 mL | Freq: Once | INTRAVENOUS | Status: AC
  Administered 2016-03-23: 1000 mL via INTRAVENOUS

## 2016-03-23 MED ORDER — PHENOL 1.4 % MT LIQD: 2.0000 | OROMUCOSAL | Status: DC | PRN

## 2016-03-23 MED ORDER — BISACODYL 10 MG RE SUPP: 10.0000 mg | Freq: Two times a day (BID) | RECTAL | Status: DC | PRN

## 2016-03-23 MED ORDER — ACETAMINOPHEN 325 MG PO TABS
650.0000 mg | ORAL_TABLET | Freq: Four times a day (QID) | ORAL | Status: DC | PRN
  Administered 2016-03-24: 650 mg via ORAL
  Filled 2016-03-23: qty 2

## 2016-03-23 MED ORDER — CALCIUM CARBONATE ANTACID 500 MG PO CHEW
1.0000 | CHEWABLE_TABLET | Freq: Every day | ORAL | Status: DC | PRN
Start: 2016-03-23 — End: 2016-03-27

## 2016-03-23 MED ORDER — SIMETHICONE 80 MG PO CHEW: 40.0000 mg | CHEWABLE_TABLET | Freq: Four times a day (QID) | ORAL | Status: DC | PRN

## 2016-03-23 MED ORDER — BLISTEX MEDICATED EX OINT
TOPICAL_OINTMENT | Freq: Two times a day (BID) | CUTANEOUS | Status: DC
  Administered 2016-03-23: 1 via TOPICAL
  Administered 2016-03-24 – 2016-03-27 (×4): via TOPICAL
  Filled 2016-03-23: qty 6.3

## 2016-03-23 MED ORDER — IOPAMIDOL (ISOVUE-300) INJECTION 61%
100.0000 mL | Freq: Once | INTRAVENOUS | Status: AC | PRN
  Administered 2016-03-23: 100 mL via INTRAVENOUS

## 2016-03-23 MED ORDER — ACETAMINOPHEN 325 MG PO TABS
650.0000 mg | ORAL_TABLET | ORAL | Status: AC
  Administered 2016-03-23: 325 mg via ORAL
  Filled 2016-03-23: qty 2

## 2016-03-23 MED ORDER — PROCHLORPERAZINE EDISYLATE 5 MG/ML IJ SOLN
5.0000 mg | INTRAMUSCULAR | Status: DC | PRN
  Filled 2016-03-23: qty 2

## 2016-03-23 MED ORDER — ENOXAPARIN SODIUM 40 MG/0.4ML ~~LOC~~ SOLN: 40.0000 mg | SUBCUTANEOUS | Status: DC

## 2016-03-23 MED ORDER — DIPHENHYDRAMINE-APAP (SLEEP) 25-500 MG PO TABS: 1.0000 | ORAL_TABLET | Freq: Every evening | ORAL | Status: DC | PRN

## 2016-03-23 MED ORDER — FAMOTIDINE 10 MG PO TABS: 10.0000 mg | ORAL_TABLET | Freq: Every day | ORAL | Status: DC | PRN

## 2016-03-23 MED ORDER — LACTATED RINGERS IV SOLN
INTRAVENOUS | Status: DC
  Administered 2016-03-23 – 2016-03-24 (×3): via INTRAVENOUS

## 2016-03-23 MED ORDER — DIPHENHYDRAMINE HCL 25 MG PO CAPS: 25.0000 mg | ORAL_CAPSULE | Freq: Every evening | ORAL | Status: DC | PRN

## 2016-03-23 MED ORDER — ACETAMINOPHEN 500 MG PO TABS: 500.0000 mg | ORAL_TABLET | Freq: Four times a day (QID) | ORAL | Status: DC | PRN

## 2016-03-23 MED ORDER — ALBUTEROL SULFATE (2.5 MG/3ML) 0.083% IN NEBU: 2.5000 mg | INHALATION_SOLUTION | RESPIRATORY_TRACT | Status: DC | PRN

## 2016-03-23 MED ORDER — FAMOTIDINE-CA CARB-MAG HYDROX 10-800-165 MG PO CHEW: 1.0000 | CHEWABLE_TABLET | Freq: Every day | ORAL | Status: DC | PRN

## 2016-03-23 MED ORDER — VITAMIN C 500 MG PO TABS
500.0000 mg | ORAL_TABLET | Freq: Two times a day (BID) | ORAL | Status: DC
  Administered 2016-03-25 (×2): 500 mg via ORAL
  Filled 2016-03-23 (×4): qty 1

## 2016-03-23 MED ORDER — METOPROLOL TARTRATE 5 MG/5ML IV SOLN: 5.0000 mg | Freq: Four times a day (QID) | INTRAVENOUS | Status: DC | PRN

## 2016-03-23 MED ORDER — LIP MEDEX EX OINT
1.0000 "application " | TOPICAL_OINTMENT | Freq: Two times a day (BID) | CUTANEOUS | Status: DC
  Filled 2016-03-23: qty 7

## 2016-03-23 MED ORDER — HYDRALAZINE HCL 20 MG/ML IJ SOLN: 10.0000 mg | INTRAMUSCULAR | Status: DC | PRN

## 2016-03-23 MED ORDER — DIPHENHYDRAMINE HCL 50 MG/ML IJ SOLN: 12.5000 mg | Freq: Four times a day (QID) | INTRAMUSCULAR | Status: DC | PRN

## 2016-03-23 MED ORDER — MAGIC MOUTHWASH: 15.0000 mL | Freq: Four times a day (QID) | ORAL | Status: DC | PRN

## 2016-03-23 MED ORDER — ONDANSETRON HCL 4 MG/2ML IJ SOLN
4.0000 mg | Freq: Four times a day (QID) | INTRAMUSCULAR | Status: DC | PRN
Start: 2016-03-23 — End: 2016-03-27
  Administered 2016-03-24 – 2016-03-26 (×2): 4 mg via INTRAVENOUS
  Filled 2016-03-23 (×2): qty 2

## 2016-03-23 MED ORDER — MENTHOL 3 MG MT LOZG: 1.0000 | LOZENGE | OROMUCOSAL | Status: DC | PRN

## 2016-03-23 NOTE — H&P (Addendum)
History and Physical    Erin Weaver [id number] DOB: April 25, 1966 DOA: 03/23/2016  Referring MD/NP/PA: Dr. Tomi Bamberger PCP: Vikki Ports, MD  Patient coming from: Home  Chief Complaint: Abdominal pain  HPI: Erin Weaver is a 50 y.o. female with medical history significant of migraine headaches; who presents with intermittent epigastric abdominal pain over the last 4 days. Patient reports being generally healthy with active lifestyle no previous health issues. Reports being awoken out of her sleep with a unbearable pain Thursday morning. The pain radiated to her back. She tried playing tennis and noted becoming nauseous and having episodes of vomiting. Associated symptoms include chills, dark yellow urine, and yellowish stools. Initially tried Pepcid without relief. Symptoms seem to be provoked within one hour of eating. Patient reports limiting food intake over the next few days. 2 days ago she tried eating, but reports variable pain for which she was up all night. Evaluated by urgent care that morning who obtained blood work and recommended her to stay on a clear liquid diet. She followed up with her family care provider this morning for a CT scan of her abdomen which revealed 4 mm at the gallbladder neck, 2.5 mm stone in the distal common bowel duct, and dilatation of the common bile duct measuring 7 mm. The urgent care called today and reported lab work revealed that she had elevated bilirubin of 4.7. She was advised to come to the emergency department for further evaluation.  ED Course: Notified regarding patient by Dr. Alwyn Pea of general surgery; who reports he will see the patient for need of cholecystectomy after evaluated by gastroenterology for likely need of ERCP for common bile duct stone obstruction.    Review of Systems: As per HPI otherwise 10 point review of systems negative.   Past Medical History:  Diagnosis Date  . Migraines     Past Surgical History:  Procedure  Laterality Date  . CESAREAN SECTION       reports that she has never smoked. She has never used smokeless tobacco. She reports that she drinks alcohol. She reports that she does not use drugs.  Allergies  Allergen Reactions  . Erythromycin Rash  . Sulfa Antibiotics Rash    Family History  Problem Relation Age of Onset  . Diabetes Mother   . Hypertension Mother   . Heart disease Father 32    MI  . Diabetes Father   . Hypertension Father   . Cancer Father     skin  . Sjogren's syndrome Sister   . Cancer Sister     skin  . Migraines Sister   . Cancer Paternal Aunt     ovarian cancer  . Depression Sister   . Irritable bowel syndrome Sister   . Migraines Sister   . Migraines Sister   . Migraines Sister     Prior to Admission medications   Medication Sig Start Date End Date Taking? Authorizing Provider  acetaminophen (TYLENOL) 325 MG tablet Take 650 mg by mouth every 6 (six) hours as needed.    Historical Provider, MD  diphenhydramine-acetaminophen (TYLENOL PM) 25-500 MG TABS tablet Take 1 tablet by mouth at bedtime as needed.    Historical Provider, MD  escitalopram (LEXAPRO) 10 MG tablet  01/15/16   Historical Provider, MD  famotidine-calcium carbonate-magnesium hydroxide (PEPCID COMPLETE) 10-800-165 MG chewable tablet Chew 1 tablet by mouth daily as needed.    Historical Provider, MD  ondansetron (ZOFRAN) 4 MG tablet Take 4 mg by mouth every  8 (eight) hours as needed.  03/21/16   Historical Provider, MD    Physical Exam: Vitals:   03/23/16 1835  BP: 107/80  Pulse: 65  Resp: 16  Temp: 98.8 F (37.1 C)  TempSrc: Oral  SpO2: 96%      Constitutional: NAD, calm, comfortable Vitals:   03/23/16 1835  BP: 107/80  Pulse: 65  Resp: 16  Temp: 98.8 F (37.1 C)  TempSrc: Oral  SpO2: 96%   Eyes: PERRL, lids and conjunctivae normal ENMT: Mucous membranes are moist. Posterior pharynx clear of any exudate or lesions.Normal dentition.  Neck: normal, supple, no masses,  no thyromegaly Respiratory: clear to auscultation bilaterally, no wheezing, no crackles. Normal respiratory effort. No accessory muscle use.  Cardiovascular: Regular rate and rhythm, no murmurs / rubs / gallops. No extremity edema. 2+ pedal pulses. No carotid bruits.  Abdomen: no tenderness, no masses palpated. No hepatosplenomegaly. Bowel sounds positive.  Musculoskeletal: no clubbing / cyanosis. No joint deformity upper and lower extremities. Good ROM, no contractures. Normal muscle tone.  Skin: no rashes, lesions, ulcers. No induration Neurologic: CN 2-12 grossly intact. Sensation intact, DTR normal. Strength 5/5 in all 4.  Psychiatric: Normal judgment and insight. Alert and oriented x 3. Normal mood.     Labs on Admission: I have personally reviewed following labs and imaging studies  CBC:  Recent Labs Lab 03/23/16 0920  WBC 4.0  NEUTROABS 2,240  HGB 13.1  HCT 37.3  MCV 90.1  PLT 0000000   Basic Metabolic Panel: No results for input(s): NA, K, CL, CO2, GLUCOSE, BUN, CREATININE, CALCIUM, MG, PHOS in the last 168 hours. GFR: CrCl cannot be calculated (No order found.). Liver Function Tests:  Recent Labs Lab 03/23/16 0920  AST 257*  ALT 430*  ALKPHOS 170*  BILITOT 4.7*  PROT 7.3  ALBUMIN 4.5   No results for input(s): LIPASE, AMYLASE in the last 168 hours. No results for input(s): AMMONIA in the last 168 hours. Coagulation Profile: No results for input(s): INR, PROTIME in the last 168 hours. Cardiac Enzymes: No results for input(s): CKTOTAL, CKMB, CKMBINDEX, TROPONINI in the last 168 hours. BNP (last 3 results) No results for input(s): PROBNP in the last 8760 hours. HbA1C: No results for input(s): HGBA1C in the last 72 hours. CBG: No results for input(s): GLUCAP in the last 168 hours. Lipid Profile: No results for input(s): CHOL, HDL, LDLCALC, TRIG, CHOLHDL, LDLDIRECT in the last 72 hours. Thyroid Function Tests: No results for input(s): TSH, T4TOTAL, FREET4,  T3FREE, THYROIDAB in the last 72 hours. Anemia Panel: No results for input(s): VITAMINB12, FOLATE, FERRITIN, TIBC, IRON, RETICCTPCT in the last 72 hours. Urine analysis: No results found for: COLORURINE, APPEARANCEUR, LABSPEC, PHURINE, GLUCOSEU, HGBUR, BILIRUBINUR, KETONESUR, PROTEINUR, UROBILINOGEN, NITRITE, LEUKOCYTESUR Sepsis Labs: No results found for this or any previous visit (from the past 240 hour(s)).   Radiological Exams on Admission: Ct Abdomen Pelvis W Contrast  Result Date: 03/23/2016 CLINICAL DATA:  Epigastric pain with right upper quadrant pain 4 days with nausea and vomiting. EXAM: CT ABDOMEN AND PELVIS WITH CONTRAST TECHNIQUE: Multidetector CT imaging of the abdomen and pelvis was performed using the standard protocol following bolus administration of intravenous contrast. CONTRAST:  153mL ISOVUE-300 IOPAMIDOL (ISOVUE-300) INJECTION 61% COMPARISON:  None. FINDINGS: Lower chest: Minimal scarring over the medial inferior right middle lobe. Hepatobiliary: Moderate cholelithiasis present. 4 mm stone projected in the region of the gallbladder neck/cystic duct. Mild prominence of the common bowel duct measuring 7 mm. Liver is within normal. The  Pancreas: Pancreas is normal. There is a 2.5 mm stone over the distal common bile duct at the ampulla. Spleen: Within normal. Adrenals/Urinary Tract: Adrenal glands are normal. Kidneys are normal in size without hydronephrosis or nephrolithiasis. Ureters and bladder are within normal. Stomach/Bowel: Stomach and small bowel are within normal. Appendix is normal. Descending colon is decompressed as the colon is otherwise within normal. Vascular/Lymphatic: Within normal. Reproductive: Within normal. Other: Small amount of free fluid in the pelvis likely physiologic. Musculoskeletal: Within normal. IMPRESSION: Moderate cholelithiasis. 4 mm stone over the region of the gallbladder neck/ cystic duct. Additional small 2.5 mm stone over the distal common bile  duct at the ampulla likely responsible for patient's pain. Mild associated dilatation of the common bile duct measuring 7 mm. Right upper quadrant ultrasound may be helpful for further evaluation to exclude acute cholecystitis. Electronically Signed   By: Marin Olp M.D.   On: 03/23/2016 14:31     Assessment/Plan Principal Problem: Common bile duct stone with obstruction Elevated LFTs: Acute Acute cholecystitis with calculus Choledocholithiasis   - Admit to a MedSurg bed - Nothing by mouth - Lactated Ringer IVF   - Tylenol prn headache - Follow-up with gastroenterology/ general surgery for further recommendations  DVT prophylaxis: Early ambulation Code Status: Full  Family Communication:  Discussed overall care with the patient and husband present at bedside  Disposition Plan: Likely discharge home once medically stable  Consults called: General surgery Admission status: inpatient   Norval Morton MD [facility] Pager [phone]  If 7PM-7AM, please contact night-coverage [email or web address] Password [email or web address]  03/23/2016, 8:52 PM

## 2016-03-23 NOTE — ED Notes (Signed)
Please order clear liquid tray when patient arrives to floor.

## 2016-03-23 NOTE — Consult Note (Signed)
Erin Weaver  19-Dec-1965 [id number]  Patient Care Team: [name], MD as PCP - General (Family Medicine)  This patient is a 50 y.o.female who calls today for surgical evaluation.   Reason for call: Elevated bili  Pleasant active woman with worsening episodes of epigastric radiating to back pain starting 4 days ago.  Vomiting.  Dark urine with lighter colored stools.  Went to Urgent care 11/4.  Bloodwork came back with elevated LFTs. T bili 4.2 H Alk Phos 139 H AST 400 ALT 600 (I think--bad scan) Amylase 28 Lipase 32 Urine dip (from UC notes) reportedly normal  Abdominal x-rays in UC--normal  Went to Dr Johnsie Kindred office.  Concern for persistent jaundice.  Repeat labs shows TBili higher at 4.7.  CT scan with CBD stone noted.  Patient's pain mild until she tries any PO & then +pain & nausea, even with liquids.  D/w ED MD Dr Dorie Rank & Dr Fuller Plan, [place].  D/w my on call partner, Dr Donne Hazel.  D/w [facility] GI MD, Dr Penelope Coop.    Plan admit to medicine Surgery consult GI consult. IVFs  Nausea control Pain control Repeat labs  Probable ERCP, then lap chole this admission.  I updated the patient's status to the patient's spouse, Toluwani Ruder.    Recommendations were made.  Questions were answered.  He expressed understanding & appreciation.  Patient Active Problem List   Diagnosis Date Noted  . Common bile duct stone 03/23/2016  . Elevated LFTs 03/23/2016  . Chronic cholecystitis with calculus 03/23/2016    Past Medical History:  Diagnosis Date  . Migraines     Past Surgical History:  Procedure Laterality Date  . CESAREAN SECTION      Social History   Social History  . Marital status: Married    Spouse name: Kallen Delatorre  . Number of children: 4  . Years of education: N/A   Occupational History  . attorney; currently homemaker    Social History Main Topics  . Smoking status: Never Smoker  . Smokeless tobacco: Never Used  . Alcohol use Yes     Comment: 2-3  drinks per week.  . Drug use: No  . Sexual activity: Yes    Partners: Male     Comment: husband with vasectomy   Other Topics Concern  . Not on file   Social History Narrative   Lives with husband, twin girls ([place]), [place], and [place]. 1 dog (Daisy)    Family History  Problem Relation Age of Onset  . Diabetes Mother   . Hypertension Mother   . Heart disease Father 17    MI  . Diabetes Father   . Hypertension Father   . Cancer Father     skin  . Sjogren's syndrome Sister   . Cancer Sister     skin  . Migraines Sister   . Cancer Paternal Aunt     ovarian cancer  . Depression Sister   . Irritable bowel syndrome Sister   . Migraines Sister   . Migraines Sister   . Migraines Sister     Current Facility-Administered Medications  Medication Dose Route Frequency Provider Last Rate Last Dose  . acetaminophen (TYLENOL) tablet 650 mg  650 mg Oral Q6H PRN Michael Boston, MD       Or  . acetaminophen (TYLENOL) suppository 650 mg  650 mg Rectal Q6H PRN Michael Boston, MD      . acetaminophen (TYLENOL) tablet 650 mg  650 mg Oral  STAT Norval Morton, MD      . alum & mag hydroxide-simeth (MAALOX/MYLANTA) 200-200-20 MG/5ML suspension 30 mL  30 mL Oral Q6H PRN Michael Boston, MD      . bisacodyl (DULCOLAX) suppository 10 mg  10 mg Rectal Q12H PRN Michael Boston, MD      . diphenhydrAMINE (BENADRYL) 12.5 MG/5ML elixir 12.5 mg  12.5 mg Oral Q6H PRN Michael Boston, MD       Or  . diphenhydrAMINE (BENADRYL) injection 12.5 mg  12.5 mg Intravenous Q6H PRN Michael Boston, MD      . enoxaparin (LOVENOX) injection 40 mg  40 mg Subcutaneous Q24H Michael Boston, MD      . hydrALAZINE (APRESOLINE) injection 10 mg  10 mg Intravenous Q2H PRN Michael Boston, MD      . ibuprofen (ADVIL,MOTRIN) tablet 400-800 mg  400-800 mg Oral Q6H PRN Michael Boston, MD      . lactated ringers bolus 1,000 mL  1,000 mL Intravenous Once Michael Boston, MD      . lactated ringers infusion   Intravenous Continuous  Michael Boston, MD      . lip balm (CARMEX) ointment 1 application  1 application Topical BID Michael Boston, MD      . magic mouthwash  15 mL Oral QID PRN Michael Boston, MD      . menthol-cetylpyridinium (CEPACOL) lozenge 3 mg  1 lozenge Oral PRN Michael Boston, MD      . metoprolol (LOPRESSOR) injection 5 mg  5 mg Intravenous Q6H PRN Michael Boston, MD      . ondansetron (ZOFRAN-ODT) disintegrating tablet 4 mg  4 mg Oral Q6H PRN Michael Boston, MD       Or  . ondansetron [facility]) injection 4 mg  4 mg Intravenous Q6H PRN Michael Boston, MD      . phenol (CHLORASEPTIC) mouth spray 2 spray  2 spray Mouth/Throat PRN Michael Boston, MD      . prochlorperazine (COMPAZINE) injection 5-10 mg  5-10 mg Intravenous Q4H PRN Michael Boston, MD      . vitamin C (ASCORBIC ACID) tablet 500 mg  500 mg Oral BID Michael Boston, MD       Current Outpatient Prescriptions  Medication Sig Dispense Refill  . acetaminophen (TYLENOL) 325 MG tablet Take 650 mg by mouth every 6 (six) hours as needed.    . diphenhydramine-acetaminophen (TYLENOL PM) 25-500 MG TABS tablet Take 1 tablet by mouth at bedtime as needed.    Marland Kitchen escitalopram (LEXAPRO) 10 MG tablet     . famotidine-calcium carbonate-magnesium hydroxide (PEPCID COMPLETE) 10-800-165 MG chewable tablet Chew 1 tablet by mouth daily as needed.    . ondansetron (ZOFRAN) 4 MG tablet Take 4 mg by mouth every 8 (eight) hours as needed.        Allergies  Allergen Reactions  . Erythromycin Rash  . Sulfa Antibiotics Rash    BP 107/80   Pulse 65   Temp 98.8 F (37.1 C) (Oral)   Resp 16   LMP 03/01/2016   SpO2 96%   Recent Results (from the past 2160 hour(s))  CBC with Differential/Platelet     Status: None   Collection Time: 03/23/16  9:20 AM  Result Value Ref Range   WBC 4.0 4.0 - 10.5 K/uL   RBC 4.14 3.80 - 5.10 MIL/uL   Hemoglobin 13.1 11.7 - 15.5 g/dL   HCT 37.3 35.0 - 45.0 %   MCV 90.1 80.0 - 100.0 fL   MCH 31.6 27.0 -  33.0 pg   MCHC 35.1 32.0 - 36.0 g/dL   RDW 12.4  11.0 - 15.0 %   Platelets 246 140 - 400 K/uL   MPV 10.0 7.5 - 12.5 fL   Neutro Abs 2,240 1,500 - 7,800 cells/uL   Lymphs Abs 1,320 850 - 3,900 cells/uL   Monocytes Absolute 320 200 - 950 cells/uL   Eosinophils Absolute 80 15 - 500 cells/uL   Basophils Absolute 40 0 - 200 cells/uL   Neutrophils Relative % 56 %   Lymphocytes Relative 33 %   Monocytes Relative 8 %   Eosinophils Relative 2 %   Basophils Relative 1 %   Smear Review Criteria for review not met   Hepatic function panel     Status: Abnormal   Collection Time: 03/23/16  9:20 AM  Result Value Ref Range   Total Bilirubin 4.7 (H) 0.2 - 1.2 mg/dL   Bilirubin, Direct 2.8 (H) <=0.2 mg/dL   Indirect Bilirubin 1.9 (H) 0.2 - 1.2 mg/dL   Alkaline Phosphatase 170 (H) 33 - 115 U/L   AST 257 (H) 10 - 35 U/L   ALT 430 (H) 6 - 29 U/L   Total Protein 7.3 6.1 - 8.1 g/dL   Albumin 4.5 3.6 - 5.1 g/dL  Hepatitis panel, acute     Status: None   Collection Time: 03/23/16  9:20 AM  Result Value Ref Range   Hepatitis B Surface Ag NEGATIVE NEGATIVE   HCV Ab NEGATIVE NEGATIVE   Hep B C IgM NON REACTIVE NON REACTIVE    Comment: High levels of Hepatitis B Core IgM antibody are detectable during the acute stage of Hepatitis B. This antibody is used to differentiate current from past HBV infection.      Hep A IgM NON REACTIVE NON REACTIVE    Comment:   Effective April 02, 2014, Hepatitis Acute Panel (test code 443 [id number]) will be revised to automatically reflex to the Hepatitis C Viral RNA, Quantitative, Real-Time PCR assay if the Hepatitis C antibody screening result is Reactive. This action is being taken to ensure that the CDC/USPSTF recommended HCV diagnostic algorithm with the appropriate test reflex needed for accurate interpretation is followed.       Ct Abdomen Pelvis W Contrast  Result Date: 03/23/2016 CLINICAL DATA:  Epigastric pain with right upper quadrant pain 4 days with nausea and vomiting. EXAM: CT ABDOMEN AND PELVIS WITH  CONTRAST TECHNIQUE: Multidetector CT imaging of the abdomen and pelvis was performed using the standard protocol following bolus administration of intravenous contrast. CONTRAST:  179m ISOVUE-300 IOPAMIDOL (ISOVUE-300) INJECTION 61% COMPARISON:  None. FINDINGS: Lower chest: Minimal scarring over the medial inferior right middle lobe. Hepatobiliary: Moderate cholelithiasis present. 4 mm stone projected in the region of the gallbladder neck/cystic duct. Mild prominence of the common bowel duct measuring 7 mm. Liver is within normal. The Pancreas: Pancreas is normal. There is a 2.5 mm stone over the distal common bile duct at the ampulla. Spleen: Within normal. Adrenals/Urinary Tract: Adrenal glands are normal. Kidneys are normal in size without hydronephrosis or nephrolithiasis. Ureters and bladder are within normal. Stomach/Bowel: Stomach and small bowel are within normal. Appendix is normal. Descending colon is decompressed as the colon is otherwise within normal. Vascular/Lymphatic: Within normal. Reproductive: Within normal. Other: Small amount of free fluid in the pelvis likely physiologic. Musculoskeletal: Within normal. IMPRESSION: Moderate cholelithiasis. 4 mm stone over the region of the gallbladder neck/ cystic duct. Additional small 2.5 mm stone over the distal common bile duct at  the ampulla likely responsible for patient's pain. Mild associated dilatation of the common bile duct measuring 7 mm. Right upper quadrant ultrasound may be helpful for further evaluation to exclude acute cholecystitis. Electronically Signed   By: Marin Olp M.D.   On: 03/23/2016 14:31    Note: This dictation was prepared with Dragon/digital dictation along with [employer]. Any transcriptional errors that result from this process are unintentional.

## 2016-03-23 NOTE — ED Triage Notes (Signed)
Pt complaining of abdominal pain after eating. Pt states had CT today, told she has a gall stone in bile duct. Pt complaining of nausea.

## 2016-03-23 NOTE — ED Provider Notes (Signed)
Pt sent into the ED for direct admission from PCP office.  Noted to have elevated LFTs, and abnormal CT scan with gallstones, dilated CBD.  Concerning for choledocholithiasis.   Dr Johney Maine, general surgery spoke with Dr Tamala Julian, hospitalist service.  Pt will be admitted to the hospitalist service.  Plan on admission, GI consult, ERCP and eventual cholecystectomy.   Dorie Rank, MD 03/23/16 2000

## 2016-03-23 NOTE — Progress Notes (Signed)
Chief Complaint  Patient presents with  . Abdominal Pain    follow up from UC visit last Friday. States a couple of years ago she had some elevated LFTS at GYN and then they were rechecked and they were fine.     Patient presents for urgent care f/u on epigastric pain and vomiting.  She woke up 11/2 at 5:30 am with epigastric pain radiating to her back.  She has had ongoing epigastric pain and vomiting (started later that afternoon), noted urine to be very dark yellow, and stools have been very light (yellow-gray).  She went to [facility] 11/4, but bloodwork didn't come back until this morning.  She was contacted and told her liver tests were elevated.  She has been on clear diet, but also had pasta and a banana, and seemed to do okay.  Had a bit of fish later, at7:30-8pm last night, and developed pain about an hour later. She was in pain from 9pm until 9am, epigastric pain which radiated to her right shoulder blade. Currently not in pain.  Denies nausea.  Vomits when in severe pain, without significant nausea preceding.  Stools had been a little loose, now more normal.  Yellow in color.  Labs from UC:  Glucose, electrolytes, Cr normal. T bili 4.2 H Alkphos 139 H AST 400 ALT 600 (I think--bad scan) Normal CBC--WBC 4.4, Hg 13.0 Amylase 28 Lipase 32 Urine dip (from UC notes) reportedly normal (surprising...) Abdominal x-rays in UC--normal  rx'd zofran 4mg  by UC--she states she didn't pick this up from pharmacy.  PMH, PSH, SH, FH reviewed and updated  Outpatient Encounter Prescriptions as of 03/23/2016  Medication Sig Note  . acetaminophen (TYLENOL) 325 MG tablet Take 650 mg by mouth every 6 (six) hours as needed.   . diphenhydramine-acetaminophen (TYLENOL PM) 25-500 MG TABS tablet Take 1 tablet by mouth at bedtime as needed.   Marland Kitchen escitalopram (LEXAPRO) 10 MG tablet  03/23/2016: Received from: External Pharmacy  . famotidine-calcium carbonate-magnesium hydroxide (PEPCID COMPLETE) 10-800-165 MG  chewable tablet Chew 1 tablet by mouth daily as needed.   . ondansetron (ZOFRAN) 4 MG tablet Take 4 mg by mouth every 8 (eight) hours as needed.  03/23/2016: Received from: External Pharmacy  . [DISCONTINUED] benzonatate (TESSALON) 200 MG capsule Take 1 capsule (200 mg total) by mouth 3 (three) times daily as needed for cough.   . [DISCONTINUED] chlorpheniramine-HYDROcodone (TUSSIONEX PENNKINETIC ER) 10-8 MG/5ML LQCR Take 5 mLs by mouth at bedtime as needed (cough).   . [DISCONTINUED] ibuprofen (ADVIL,MOTRIN) 200 MG tablet Take 200 mg by mouth every 6 (six) hours as needed.   . [DISCONTINUED] Pseudoeph-Doxylamine-DM-APAP (NYQUIL PO) Take 30 mLs by mouth daily.   . [DISCONTINUED] Pseudoephedrine-Guaifenesin (MUCINEX D PO) Take 1 tablet by mouth daily.    No facility-administered encounter medications on file as of 03/23/2016.    Allergies  Allergen Reactions  . Erythromycin Rash  . Sulfa Antibiotics Rash    ROS:  Slight chills, related to pain. No known fevers.  Denies URI symptoms, cough, shortness of breath (just related to pain). Denies bleeding, bruising, rashes.  PHYSICAL EXAM:  BP 118/76 (BP Location: Left Arm, Patient Position: Sitting, Cuff Size: Normal)   Pulse 64   Temp 99 F (37.2 C) (Tympanic)   Ht 5\' 2"  (1.575 m)   Wt 111 lb 3.2 oz (50.4 kg)   LMP 03/01/2016   BMI 20.34 kg/m   Well appearing female, appears slightly anxious, in no distress HEENT: conjunctiva clear, sclera anicteric. OP  moist mucus membranes Neck: no lymphadenopathy or mass Heart: regular rate and rhythm without murmur Lungs: clear bilaterally Skin: no jaundice Abdomen: +epgastric and RUQ pain.  +Murphy sign.  Liver mildly enlarged, at level of RCM. No rebound tenderness or guarding, no mass Extremities: no edema  ASSESSMENT/PLAN:  Abdominal pain, epigastric - suspect gallstone, causing obstruction.  Unable to get US--will check CT today. Reorder LFT's to see if improving/worsening.  Check WBC given  LG fever in office - Plan: CBC with Differential/Platelet, Hepatic function panel  Elevated LFTs - Plan: Hepatic function panel, Hepatitis panel, acute    CBC, LFT's stat Acute hepatitis panel

## 2016-03-24 ENCOUNTER — Encounter (HOSPITAL_COMMUNITY): Payer: Self-pay | Admitting: Surgery

## 2016-03-24 ENCOUNTER — Inpatient Hospital Stay (HOSPITAL_COMMUNITY): Payer: 59 | Admitting: Certified Registered Nurse Anesthetist

## 2016-03-24 ENCOUNTER — Encounter (HOSPITAL_COMMUNITY)
Admission: EM | Disposition: A | Discharge status: Home / Self Care | Payer: Self-pay | Source: Home / Self Care | Attending: Internal Medicine

## 2016-03-24 ENCOUNTER — Inpatient Hospital Stay (HOSPITAL_COMMUNITY): Payer: 59

## 2016-03-24 DIAGNOSIS — F329 Major depressive disorder, single episode, unspecified: Secondary | ICD-10-CM

## 2016-03-24 HISTORY — PX: LAPAROSCOPIC CHOLECYSTECTOMY SINGLE SITE WITH INTRAOPERATIVE CHOLANGIOGRAM: SHX6538

## 2016-03-24 LAB — CBC
HCT: 36.9 % (ref 36.0–46.0)
Hemoglobin: 12.4 g/dL (ref 12.0–15.0)
MCH: 31 pg (ref 26.0–34.0)
MCHC: 33.6 g/dL (ref 30.0–36.0)
MCV: 92.3 fL (ref 78.0–100.0)
PLATELETS: 202 10*3/uL (ref 150–400)
RBC: 4 MIL/uL (ref 3.87–5.11)
RDW: 13 % (ref 11.5–15.5)
WBC: 4.2 10*3/uL (ref 4.0–10.5)

## 2016-03-24 LAB — COMPREHENSIVE METABOLIC PANEL
ALK PHOS: 126 U/L (ref 38–126)
ALT: 304 U/L — AB (ref 14–54)
AST: 128 U/L — AB (ref 15–41)
Albumin: 3.8 g/dL (ref 3.5–5.0)
Anion gap: 10 (ref 5–15)
BILIRUBIN TOTAL: 2 mg/dL — AB (ref 0.3–1.2)
BUN: 6 mg/dL (ref 6–20)
CALCIUM: 9.3 mg/dL (ref 8.9–10.3)
CHLORIDE: 106 mmol/L (ref 101–111)
CO2: 25 mmol/L (ref 22–32)
CREATININE: 0.78 mg/dL (ref 0.44–1.00)
Glucose, Bld: 77 mg/dL (ref 65–99)
Potassium: 4.2 mmol/L (ref 3.5–5.1)
Sodium: 141 mmol/L (ref 135–145)
TOTAL PROTEIN: 6.3 g/dL — AB (ref 6.5–8.1)

## 2016-03-24 LAB — I-STAT BETA HCG BLOOD, ED (NOT ORDERABLE)

## 2016-03-24 LAB — LIPASE, BLOOD: LIPASE: 25 U/L (ref 11–51)

## 2016-03-24 LAB — MRSA PCR SCREENING: MRSA by PCR: NEGATIVE

## 2016-03-24 SURGERY — LAPAROSCOPIC CHOLECYSTECTOMY SINGLE SITE WITH INTRAOPERATIVE CHOLANGIOGRAM
Anesthesia: General | Site: Abdomen

## 2016-03-24 MED ORDER — FENTANYL CITRATE (PF) 100 MCG/2ML IJ SOLN
INTRAMUSCULAR | Status: AC
  Filled 2016-03-24: qty 4

## 2016-03-24 MED ORDER — SODIUM CHLORIDE 0.9 % IR SOLN
Status: DC | PRN
  Administered 2016-03-24: 1000 mL

## 2016-03-24 MED ORDER — BUPIVACAINE HCL (PF) 0.25 % IJ SOLN
INTRAMUSCULAR | Status: AC
  Filled 2016-03-24: qty 60

## 2016-03-24 MED ORDER — NEOSTIGMINE METHYLSULFATE 5 MG/5ML IV SOSY
PREFILLED_SYRINGE | INTRAVENOUS | Status: AC
  Filled 2016-03-24: qty 5

## 2016-03-24 MED ORDER — IOPAMIDOL (ISOVUE-300) INJECTION 61%
INTRAVENOUS | Status: AC
  Filled 2016-03-24: qty 50

## 2016-03-24 MED ORDER — SUCCINYLCHOLINE CHLORIDE 200 MG/10ML IV SOSY
PREFILLED_SYRINGE | INTRAVENOUS | Status: AC
  Filled 2016-03-24: qty 10

## 2016-03-24 MED ORDER — LACTATED RINGERS IV SOLN
INTRAVENOUS | Status: DC
  Administered 2016-03-25: 08:00:00 via INTRAVENOUS

## 2016-03-24 MED ORDER — CELECOXIB 400 MG PO CAPS
400.0000 mg | ORAL_CAPSULE | ORAL | Status: AC
  Administered 2016-03-24: 400 mg via ORAL
  Filled 2016-03-24: qty 2
  Filled 2016-03-24: qty 1

## 2016-03-24 MED ORDER — INFLUENZA VAC SPLIT QUAD 0.5 ML IM SUSY: 0.5000 mL | PREFILLED_SYRINGE | INTRAMUSCULAR | Status: DC

## 2016-03-24 MED ORDER — 0.9 % SODIUM CHLORIDE (POUR BTL) OPTIME
TOPICAL | Status: DC | PRN
  Administered 2016-03-24: 1000 mL

## 2016-03-24 MED ORDER — EPHEDRINE SULFATE-NACL 50-0.9 MG/10ML-% IV SOSY
PREFILLED_SYRINGE | INTRAVENOUS | Status: DC | PRN
  Administered 2016-03-24: 5 mg via INTRAVENOUS

## 2016-03-24 MED ORDER — PHENYLEPHRINE 40 MCG/ML (10ML) SYRINGE FOR IV PUSH (FOR BLOOD PRESSURE SUPPORT)
PREFILLED_SYRINGE | INTRAVENOUS | Status: DC | PRN
  Administered 2016-03-24: 40 ug via INTRAVENOUS

## 2016-03-24 MED ORDER — ESCITALOPRAM OXALATE 10 MG PO TABS
10.0000 mg | ORAL_TABLET | Freq: Every day | ORAL | Status: DC
  Filled 2016-03-24 (×2): qty 1

## 2016-03-24 MED ORDER — METHOCARBAMOL 1000 MG/10ML IJ SOLN
1000.0000 mg | Freq: Four times a day (QID) | INTRAVENOUS | Status: DC | PRN
  Administered 2016-03-25 – 2016-03-26 (×3): 1000 mg via INTRAVENOUS
  Filled 2016-03-24 (×7): qty 10

## 2016-03-24 MED ORDER — HYDROMORPHONE HCL 2 MG/ML IJ SOLN
INTRAMUSCULAR | Status: AC
  Filled 2016-03-24: qty 1

## 2016-03-24 MED ORDER — SCOPOLAMINE 1 MG/3DAYS TD PT72
MEDICATED_PATCH | TRANSDERMAL | Status: AC
  Administered 2016-03-24: 1.5 mg via TRANSDERMAL
  Filled 2016-03-24: qty 1

## 2016-03-24 MED ORDER — MIDAZOLAM HCL 5 MG/5ML IJ SOLN
INTRAMUSCULAR | Status: DC | PRN
  Administered 2016-03-24: 2 mg via INTRAVENOUS

## 2016-03-24 MED ORDER — TRAMADOL HCL 50 MG PO TABS: 50.0000 mg | ORAL_TABLET | Freq: Four times a day (QID) | ORAL | 0 refills | Status: DC | PRN

## 2016-03-24 MED ORDER — ONDANSETRON HCL 4 MG/2ML IJ SOLN
INTRAMUSCULAR | Status: AC
Start: 2016-03-24 — End: 2016-03-24
  Filled 2016-03-24: qty 2

## 2016-03-24 MED ORDER — SUCCINYLCHOLINE CHLORIDE 200 MG/10ML IV SOSY
PREFILLED_SYRINGE | INTRAVENOUS | Status: DC | PRN
  Administered 2016-03-24: 120 mg via INTRAVENOUS

## 2016-03-24 MED ORDER — CHLORHEXIDINE GLUCONATE CLOTH 2 % EX PADS
6.0000 | MEDICATED_PAD | Freq: Once | CUTANEOUS | Status: AC
  Administered 2016-03-24: 6 via TOPICAL

## 2016-03-24 MED ORDER — BUPIVACAINE HCL (PF) 0.25 % IJ SOLN
INTRAMUSCULAR | Status: DC | PRN
  Administered 2016-03-24: 35 mL

## 2016-03-24 MED ORDER — SODIUM CHLORIDE 0.9 % IV SOLN
INTRAVENOUS | Status: DC | PRN
  Administered 2016-03-24: 50 mL

## 2016-03-24 MED ORDER — GABAPENTIN 300 MG PO CAPS
300.0000 mg | ORAL_CAPSULE | ORAL | Status: AC
  Administered 2016-03-24: 300 mg via ORAL
  Filled 2016-03-24: qty 1

## 2016-03-24 MED ORDER — MIDAZOLAM HCL 2 MG/2ML IJ SOLN
INTRAMUSCULAR | Status: AC
Start: 2016-03-24 — End: 2016-03-24
  Filled 2016-03-24: qty 2

## 2016-03-24 MED ORDER — GLUCAGON HCL RDNA (DIAGNOSTIC) 1 MG IJ SOLR
INTRAMUSCULAR | Status: DC | PRN
  Administered 2016-03-24: 1 mg via INTRAVENOUS

## 2016-03-24 MED ORDER — HYDROMORPHONE HCL 1 MG/ML IJ SOLN
0.2500 mg | INTRAMUSCULAR | Status: DC | PRN
  Administered 2016-03-24 (×4): 0.5 mg via INTRAVENOUS

## 2016-03-24 MED ORDER — LACTATED RINGERS IV SOLN
INTRAVENOUS | Status: DC | PRN
  Administered 2016-03-24 (×2): via INTRAVENOUS

## 2016-03-24 MED ORDER — FENTANYL CITRATE (PF) 100 MCG/2ML IJ SOLN
INTRAMUSCULAR | Status: DC | PRN
  Administered 2016-03-24: 100 ug via INTRAVENOUS
  Administered 2016-03-24 (×3): 50 ug via INTRAVENOUS

## 2016-03-24 MED ORDER — PHENYLEPHRINE 40 MCG/ML (10ML) SYRINGE FOR IV PUSH (FOR BLOOD PRESSURE SUPPORT)
PREFILLED_SYRINGE | INTRAVENOUS | Status: AC
  Filled 2016-03-24: qty 10

## 2016-03-24 MED ORDER — TRAMADOL HCL 50 MG PO TABS
50.0000 mg | ORAL_TABLET | Freq: Four times a day (QID) | ORAL | Status: DC | PRN
  Administered 2016-03-24: 100 mg via ORAL
  Filled 2016-03-24: qty 1
  Filled 2016-03-24: qty 2

## 2016-03-24 MED ORDER — FENTANYL CITRATE (PF) 100 MCG/2ML IJ SOLN
INTRAMUSCULAR | Status: AC
  Filled 2016-03-24: qty 2

## 2016-03-24 MED ORDER — ACETAMINOPHEN 500 MG PO TABS
1000.0000 mg | ORAL_TABLET | ORAL | Status: AC
  Administered 2016-03-24: 1000 mg via ORAL
  Filled 2016-03-24: qty 2

## 2016-03-24 MED ORDER — FENTANYL CITRATE (PF) 100 MCG/2ML IJ SOLN
25.0000 ug | INTRAMUSCULAR | Status: DC | PRN
  Administered 2016-03-24 – 2016-03-25 (×3): 25 ug via INTRAVENOUS
  Filled 2016-03-24 (×3): qty 2

## 2016-03-24 MED ORDER — PROPOFOL 10 MG/ML IV BOLUS
INTRAVENOUS | Status: DC | PRN
  Administered 2016-03-24: 160 mg via INTRAVENOUS

## 2016-03-24 MED ORDER — LIDOCAINE 2% (20 MG/ML) 5 ML SYRINGE
INTRAMUSCULAR | Status: AC
  Filled 2016-03-24: qty 5

## 2016-03-24 MED ORDER — PROPOFOL 10 MG/ML IV BOLUS
INTRAVENOUS | Status: AC
Start: 2016-03-24 — End: 2016-03-24
  Filled 2016-03-24: qty 20

## 2016-03-24 MED ORDER — NEOSTIGMINE METHYLSULFATE 5 MG/5ML IV SOSY
PREFILLED_SYRINGE | INTRAVENOUS | Status: DC | PRN
Start: 2016-03-24 — End: 2016-03-24
  Administered 2016-03-24: 3 mg via INTRAVENOUS

## 2016-03-24 MED ORDER — ROCURONIUM BROMIDE 10 MG/ML (PF) SYRINGE
PREFILLED_SYRINGE | INTRAVENOUS | Status: DC | PRN
  Administered 2016-03-24: 20 mg via INTRAVENOUS

## 2016-03-24 MED ORDER — GLYCOPYRROLATE 0.2 MG/ML IV SOSY
PREFILLED_SYRINGE | INTRAVENOUS | Status: DC | PRN
  Administered 2016-03-24: 0.4 mg via INTRAVENOUS

## 2016-03-24 MED ORDER — LIDOCAINE 2% (20 MG/ML) 5 ML SYRINGE
INTRAMUSCULAR | Status: DC | PRN
  Administered 2016-03-24: 100 mg via INTRAVENOUS

## 2016-03-24 MED ORDER — GLYCOPYRROLATE 0.2 MG/ML IV SOSY
PREFILLED_SYRINGE | INTRAVENOUS | Status: AC
  Filled 2016-03-24: qty 3

## 2016-03-24 MED ORDER — SCOPOLAMINE 1 MG/3DAYS TD PT72
1.0000 | MEDICATED_PATCH | TRANSDERMAL | Status: AC
  Administered 2016-03-24: 1.5 mg via TRANSDERMAL
  Filled 2016-03-24: qty 1

## 2016-03-24 MED ORDER — EPHEDRINE 5 MG/ML INJ
INTRAVENOUS | Status: AC
  Filled 2016-03-24: qty 10

## 2016-03-24 MED ORDER — PROMETHAZINE HCL 25 MG/ML IJ SOLN: 6.2500 mg | INTRAMUSCULAR | Status: DC | PRN

## 2016-03-24 MED ORDER — CHLORHEXIDINE GLUCONATE CLOTH 2 % EX PADS: 6.0000 | MEDICATED_PAD | Freq: Once | CUTANEOUS | Status: DC

## 2016-03-24 MED ORDER — ONDANSETRON HCL 4 MG/2ML IJ SOLN
INTRAMUSCULAR | Status: DC | PRN
  Administered 2016-03-24: 4 mg via INTRAVENOUS

## 2016-03-24 SURGICAL SUPPLY — 43 items
APPLIER CLIP 5 13 M/L LIGAMAX5 (MISCELLANEOUS) ×3
BLADE SURG ROTATE 9660 (MISCELLANEOUS) IMPLANT
CANISTER SUCTION 2500CC (MISCELLANEOUS) ×3 IMPLANT
CHLORAPREP W/TINT 26ML (MISCELLANEOUS) ×3 IMPLANT
CLIP APPLIE 5 13 M/L LIGAMAX5 (MISCELLANEOUS) ×1 IMPLANT
COVER MAYO STAND STRL (DRAPES) ×3 IMPLANT
COVER SURGICAL LIGHT HANDLE (MISCELLANEOUS) ×3 IMPLANT
DRAPE C-ARM 42X72 X-RAY (DRAPES) ×3 IMPLANT
DRAPE WARM FLUID 44X44 (DRAPE) ×3 IMPLANT
DRSG TEGADERM 4X4.75 (GAUZE/BANDAGES/DRESSINGS) ×3 IMPLANT
ELECT REM PT RETURN 9FT ADLT (ELECTROSURGICAL) ×3
ELECTRODE REM PT RTRN 9FT ADLT (ELECTROSURGICAL) ×1 IMPLANT
ENDOLOOP SUT PDS II  0 18 (SUTURE) ×2
ENDOLOOP SUT PDS II 0 18 (SUTURE) ×1 IMPLANT
GAUZE SPONGE 2X2 8PLY STRL LF (GAUZE/BANDAGES/DRESSINGS) ×1 IMPLANT
GLOVE BIOGEL PI IND STRL 8 (GLOVE) ×1 IMPLANT
GLOVE BIOGEL PI INDICATOR 8 (GLOVE) ×2
GLOVE ECLIPSE 8.0 STRL XLNG CF (GLOVE) ×3 IMPLANT
GOWN STRL REUS W/ TWL LRG LVL3 (GOWN DISPOSABLE) ×2 IMPLANT
GOWN STRL REUS W/ TWL XL LVL3 (GOWN DISPOSABLE) ×1 IMPLANT
GOWN STRL REUS W/TWL LRG LVL3 (GOWN DISPOSABLE) ×4
GOWN STRL REUS W/TWL XL LVL3 (GOWN DISPOSABLE) ×2
KIT BASIN OR (CUSTOM PROCEDURE TRAY) ×3 IMPLANT
KIT ROOM TURNOVER OR (KITS) ×3 IMPLANT
NEEDLE 22X1 1/2 (OR ONLY) (NEEDLE) ×3 IMPLANT
NS IRRIG 1000ML POUR BTL (IV SOLUTION) ×3 IMPLANT
PAD ARMBOARD 7.5X6 YLW CONV (MISCELLANEOUS) ×6 IMPLANT
POUCH SPECIMEN RETRIEVAL 10MM (ENDOMECHANICALS) IMPLANT
SCALPEL HARMONIC ACE (MISCELLANEOUS) ×3 IMPLANT
SCISSORS LAP 5X35 DISP (ENDOMECHANICALS) ×3 IMPLANT
SET CHOLANGIOGRAPH 5 50 .035 (SET/KITS/TRAYS/PACK) ×3 IMPLANT
SET IRRIG TUBING LAPAROSCOPIC (IRRIGATION / IRRIGATOR) ×3 IMPLANT
SPECIMEN JAR SMALL (MISCELLANEOUS) ×3 IMPLANT
SPONGE GAUZE 2X2 STER 10/PKG (GAUZE/BANDAGES/DRESSINGS) ×2
SUT MNCRL AB 4-0 PS2 18 (SUTURE) ×3 IMPLANT
SUT PDS AB 1 CT  36 (SUTURE) ×4
SUT PDS AB 1 CT 36 (SUTURE) ×2 IMPLANT
SUT VICRYL 0 TIES 12 18 (SUTURE) IMPLANT
TOWEL OR 17X26 10 PK STRL BLUE (TOWEL DISPOSABLE) ×3 IMPLANT
TRAY LAPAROSCOPIC MC (CUSTOM PROCEDURE TRAY) ×3 IMPLANT
TROCAR 5M 150ML BLDLS (TROCAR) ×3 IMPLANT
TROCAR XCEL NON-BLD 5MMX100MML (ENDOMECHANICALS) ×3 IMPLANT
TUBING INSUFFLATION (TUBING) ×3 IMPLANT

## 2016-03-24 NOTE — Transfer of Care (Signed)
Immediate Anesthesia Transfer of Care Note  Patient: Erin Weaver  Procedure(s) Performed: Procedure(s): LAPAROSCOPIC CHOLECYSTECTOMY SINGLE SITE WITH INTRAOPERATIVE CHOLANGIOGRAM (N/A)  Patient Location: PACU  Anesthesia Type:General  Level of Consciousness: awake and alert   Airway & Oxygen Therapy: Patient Spontanous Breathing  Post-op Assessment: Report given to RN, Post -op Vital signs reviewed and stable and Patient moving all extremities X 4  Post vital signs: Reviewed and stable  Last Vitals:  Vitals:   03/24/16 1254 03/24/16 1816  BP: 104/63   Pulse: 61 86  Resp: 19 19  Temp: 36.7 C 36.7 C    Last Pain:  Vitals:   03/24/16 1254  TempSrc: Oral  PainSc:          Complications: No apparent anesthesia complications

## 2016-03-24 NOTE — Progress Notes (Signed)
1425 Pt to preop via bed, report given to Clarise Cruz in short stay. Surgical consent signed by the pt. NPO maint.

## 2016-03-24 NOTE — Consult Note (Signed)
[facility] Consult Note  Referring Provider: No ref. provider found Primary Care Physician:  Vikki Ports, MD Primary Gastroenterologist:  Dr.  Laurel Dimmer Complaint: Epigastric pain HPI: Erin Weaver is an 50 y.o. white female  who presented with onset of epigastric abdominal pain through 4 days ago which is wax and wane in the interim. She was found to have a elevated liver function tests with a bilirubin 4.7 gallstones on ultrasound with distal common bile duct stone on CT scan. Today her bilirubin is down to 2.1 and she is essentially pain free. She is tentatively posted for microscopic cholecystectomy this afternoon. We are consulted to consider ERCP if needed.  Past Medical History:  Diagnosis Date  . Choledocholithiasis 03/23/2016  . Migraines     Past Surgical History:  Procedure Laterality Date  . CESAREAN SECTION      Medications Prior to Admission  Medication Sig Dispense Refill  . acetaminophen (TYLENOL) 325 MG tablet Take 650 mg by mouth every 6 (six) hours as needed.    . diphenhydramine-acetaminophen (TYLENOL PM) 25-500 MG TABS tablet Take 1 tablet by mouth at bedtime as needed.    . famotidine-calcium carbonate-magnesium hydroxide (PEPCID COMPLETE) 10-800-165 MG chewable tablet Chew 1 tablet by mouth daily as needed.    Marland Kitchen ibuprofen (ADVIL,MOTRIN) 200 MG tablet Take 400 mg by mouth every 6 (six) hours as needed.    Marland Kitchen escitalopram (LEXAPRO) 10 MG tablet       Allergies:  Allergies  Allergen Reactions  . Erythromycin Rash  . Sulfa Antibiotics Rash    Family History  Problem Relation Age of Onset  . Diabetes Mother   . Hypertension Mother   . Heart disease Father 101    MI  . Diabetes Father   . Hypertension Father   . Cancer Father     skin  . Sjogren's syndrome Sister   . Cancer Sister     skin  . Migraines Sister   . Cancer Paternal Aunt     ovarian cancer  . Depression Sister   . Irritable bowel syndrome Sister   . Migraines Sister   .  Migraines Sister   . Migraines Sister     Social History:  reports that she has never smoked. She has never used smokeless tobacco. She reports that she drinks alcohol. She reports that she does not use drugs.  Review of Systems: negative except As above   Blood pressure (!) 113/58, pulse (!) 55, temperature 97.9 F (36.6 C), temperature source Oral, resp. rate 19, last menstrual period 03/01/2016, SpO2 100 %. Head: Normocephalic, without obvious abnormality, atraumatic Neck: no adenopathy, no carotid bruit, no JVD, supple, symmetrical, trachea midline and thyroid not enlarged, symmetric, no tenderness/mass/nodules Resp: clear to auscultation bilaterally Cardio: regular rate and rhythm, S1, S2 normal, no murmur, click, rub or gallop GI: Adamant soft nondistended with normoactive bowel sounds. No hepatomegaly masses or guarding Extremities: extremities normal, atraumatic, no cyanosis or edema  Results for orders placed or performed during the hospital encounter of 03/23/16 (from the past 48 hour(s))  Comprehensive metabolic panel     Status: Abnormal   Collection Time: 03/24/16  6:25 AM  Result Value Ref Range   Sodium 141 135 - 145 mmol/L   Potassium 4.2 3.5 - 5.1 mmol/L   Chloride 106 101 - 111 mmol/L   CO2 25 22 - 32 mmol/L   Glucose, Bld 77 65 - 99 mg/dL   BUN 6 6 - 20 mg/dL   Creatinine,  Ser 0.78 0.44 - 1.00 mg/dL   Calcium 9.3 8.9 - 10.3 mg/dL   Total Protein 6.3 (L) 6.5 - 8.1 g/dL   Albumin 3.8 3.5 - 5.0 g/dL   AST 128 (H) 15 - 41 U/L   ALT 304 (H) 14 - 54 U/L   Alkaline Phosphatase 126 38 - 126 U/L   Total Bilirubin 2.0 (H) 0.3 - 1.2 mg/dL   GFR calc non Af Amer >60 >60 mL/min   GFR calc Af Amer >60 >60 mL/min    Comment: (NOTE) The eGFR has been calculated using the CKD EPI equation. This calculation has not been validated in all clinical situations. eGFR's persistently <60 mL/min signify possible Chronic Kidney Disease.    Anion gap 10 5 - 15  Lipase, blood      Status: None   Collection Time: 03/24/16  6:25 AM  Result Value Ref Range   Lipase 25 11 - 51 U/L  CBC     Status: None   Collection Time: 03/24/16  6:25 AM  Result Value Ref Range   WBC 4.2 4.0 - 10.5 K/uL   RBC 4.00 3.87 - 5.11 MIL/uL   Hemoglobin 12.4 12.0 - 15.0 g/dL   HCT 36.9 36.0 - 46.0 %   MCV 92.3 78.0 - 100.0 fL   MCH 31.0 26.0 - 34.0 pg   MCHC 33.6 30.0 - 36.0 g/dL   RDW 13.0 11.5 - 15.5 %   Platelets 202 150 - 400 K/uL   Ct Abdomen Pelvis W Contrast  Result Date: 03/23/2016 CLINICAL DATA:  Epigastric pain with right upper quadrant pain 4 days with nausea and vomiting. EXAM: CT ABDOMEN AND PELVIS WITH CONTRAST TECHNIQUE: Multidetector CT imaging of the abdomen and pelvis was performed using the standard protocol following bolus administration of intravenous contrast. CONTRAST:  177m ISOVUE-300 IOPAMIDOL (ISOVUE-300) INJECTION 61% COMPARISON:  None. FINDINGS: Lower chest: Minimal scarring over the medial inferior right middle lobe. Hepatobiliary: Moderate cholelithiasis present. 4 mm stone projected in the region of the gallbladder neck/cystic duct. Mild prominence of the common bowel duct measuring 7 mm. Liver is within normal. The Pancreas: Pancreas is normal. There is a 2.5 mm stone over the distal common bile duct at the ampulla. Spleen: Within normal. Adrenals/Urinary Tract: Adrenal glands are normal. Kidneys are normal in size without hydronephrosis or nephrolithiasis. Ureters and bladder are within normal. Stomach/Bowel: Stomach and small bowel are within normal. Appendix is normal. Descending colon is decompressed as the colon is otherwise within normal. Vascular/Lymphatic: Within normal. Reproductive: Within normal. Other: Small amount of free fluid in the pelvis likely physiologic. Musculoskeletal: Within normal. IMPRESSION: Moderate cholelithiasis. 4 mm stone over the region of the gallbladder neck/ cystic duct. Additional small 2.5 mm stone over the distal common bile duct  at the ampulla likely responsible for patient's pain. Mild associated dilatation of the common bile duct measuring 7 mm. Right upper quadrant ultrasound may be helpful for further evaluation to exclude acute cholecystitis. Electronically Signed   By: DMarin OlpM.D.   On: 03/23/2016 14:31    Assessment: Cholelithiasis with choledocholithiasis, Plan:  Discussed strategy with Dr. gross. Patient posted for laparoscopic cholecystectomy with intraoperative cholangiogram. Will be on standby for ERCP if retained common bile duct stone found. Temima Kutsch C 03/24/2016, 8:43 AM  Pager [phone] no answer or after 5 PM call [phone]

## 2016-03-24 NOTE — Progress Notes (Signed)
[facility]  [address] [place], [place] [address] Phone: [phone] FAX: [phone]   Erin Weaver [id number] [birth year]  CARE TEAM:  PCP: Vikki Ports, MD  Outpatient Care Team: Patient Care Team: [name], MD as PCP - General (Family Medicine)  Inpatient Treatment Team: Treatment Team: Attending Provider: Norval Morton, MD; Attending Provider: Norval Morton, MD; Consulting Physician: Nolon Nations, MD; Consulting Physician: Michael Boston, MD; Consulting Physician: Wonda Horner, MD; Registered Nurse: Roselind Rily, RN; Rounding Team: Minerva Ends, MD  Problem List:   Principal Problem:   Common bile duct stone Active Problems:   Elevated LFTs   Chronic cholecystitis with calculus   Choledocholithiasis   Common bile duct (CBD) obstruction         Assessment  CBD stone with improved LFTs, probably CBD has passed  Plan:  -lap chole w IOC 1st.  Plan later today.  If IOC+, then ERCP tomorrow if GI agrees/has availability:  The anatomy & physiology of hepatobiliary & pancreatic function was discussed.  The pathophysiology of gallbladder dysfunction was discussed.  Natural history risks without surgery was discussed.   I feel the risks of no intervention will lead to serious problems that outweigh the operative risks; therefore, I recommended cholecystectomy to remove the pathology.  I explained laparoscopic techniques with possible need for an open approach.  Probable cholangiogram to evaluate the bilary tract was explained as well.    Risks such as bleeding, infection, abscess, leak, injury to other organs, need for repair of tissues / organs, need for further treatment, stroke, heart attack, death, and other risks were discussed.  I noted a good likelihood this will help address the problem.  Possibility that this will not correct all abdominal symptoms was explained.  Goals of post-operative recovery were discussed  as well.  We will work to minimize complications.  An educational handout further explaining the pathology and treatment options was given as well.  Questions were answered.  The patient & her husband express understanding & wish to proceed with surgery.   -VTE prophylaxis- SCDs, etc -mobilize as tolerated to help recovery  [name], M.D., F.A.C.S. Gastrointestinal and Minimally Invasive Surgery [facility] [address] [place], [place] [address] [phone] Main / Paging   03/24/2016  Subjective:  Denies pain No nausea Husband at bedside RN just came in room   Objective:  Vital signs:  Vitals:   03/23/16 1835 03/23/16 2154  BP: 107/80 118/85  Pulse: 65 60  Resp: 16 19  Temp: 98.8 F (37.1 C) 97.9 F (36.6 C)  TempSrc: Oral Oral  SpO2: 96% 100%    Last BM Date: 03/23/16  Intake/Output   Yesterday:  11/06 0701 - 11/07 0700 In: 670 [I.V.:670] Out: -  This shift:  Total I/O In: 670 [I.V.:670] Out: -   Bowel function:  Flatus: YES  BM:  YES  Drain: (No drain)   Physical Exam:  General: Pt awake/alert/oriented x4 in No acute distress Eyes: PERRL, normal EOM.  Sclera clear.  No icterus Neuro: CN II-XII intact w/o focal sensory/motor deficits. Lymph: No head/neck/groin lymphadenopathy Psych:  No delerium/psychosis/paranoia HENT: Normocephalic, Mucus membranes moist.  No thrush Neck: Supple, No tracheal deviation Chest: No chest wall pain w good excursion CV:  Pulses intact.  Regular rhythm MS: Normal AROM mjr joints.  No obvious deformity Abdomen: Soft.  Nondistended.  Nontender.  No evidence of peritonitis.  No  incarcerated hernias. Ext:  SCDs BLE.  No mjr edema.  No cyanosis Skin: No petechiae / purpura.  Less Jaundiced  Results:   Labs: Results for orders placed or performed in visit on 03/23/16 (from the past 48 hour(s))  CBC with Differential/Platelet     Status: None   Collection Time: 03/23/16   9:20 AM  Result Value Ref Range   WBC 4.0 4.0 - 10.5 K/uL   RBC 4.14 3.80 - 5.10 MIL/uL   Hemoglobin 13.1 11.7 - 15.5 g/dL   HCT 34.9 75.3 - 04.5 %   MCV 90.1 80.0 - 100.0 fL   MCH 31.6 27.0 - 33.0 pg   MCHC 35.1 32.0 - 36.0 g/dL   RDW 96.6 23.2 - 54.1 %   Platelets 246 140 - 400 K/uL   MPV 10.0 7.5 - 12.5 fL   Neutro Abs 2,240 1,500 - 7,800 cells/uL   Lymphs Abs 1,320 850 - 3,900 cells/uL   Monocytes Absolute 320 200 - 950 cells/uL   Eosinophils Absolute 80 15 - 500 cells/uL   Basophils Absolute 40 0 - 200 cells/uL   Neutrophils Relative % 56 %   Lymphocytes Relative 33 %   Monocytes Relative 8 %   Eosinophils Relative 2 %   Basophils Relative 1 %   Smear Review Criteria for review not met   Hepatic function panel     Status: Abnormal   Collection Time: 03/23/16  9:20 AM  Result Value Ref Range   Total Bilirubin 4.7 (H) 0.2 - 1.2 mg/dL   Bilirubin, Direct 2.8 (H) <=0.2 mg/dL   Indirect Bilirubin 1.9 (H) 0.2 - 1.2 mg/dL   Alkaline Phosphatase 170 (H) 33 - 115 U/L   AST 257 (H) 10 - 35 U/L   ALT 430 (H) 6 - 29 U/L   Total Protein 7.3 6.1 - 8.1 g/dL   Albumin 4.5 3.6 - 5.1 g/dL  Hepatitis panel, acute     Status: None   Collection Time: 03/23/16  9:20 AM  Result Value Ref Range   Hepatitis B Surface Ag NEGATIVE NEGATIVE   HCV Ab NEGATIVE NEGATIVE   Hep B C IgM NON REACTIVE NON REACTIVE    Comment: High levels of Hepatitis B Core IgM antibody are detectable during the acute stage of Hepatitis B. This antibody is used to differentiate current from past HBV infection.      Hep A IgM NON REACTIVE NON REACTIVE    Comment:   Effective April 02, 2014, Hepatitis Acute Panel (test code 73613) will be revised to automatically reflex to the Hepatitis C Viral RNA, Quantitative, Real-Time PCR assay if the Hepatitis C antibody screening result is Reactive. This action is being taken to ensure that the CDC/USPSTF recommended HCV diagnostic algorithm with the appropriate test  reflex needed for accurate interpretation is followed.       Imaging / Studies: Ct Abdomen Pelvis W Contrast  Result Date: 03/23/2016 CLINICAL DATA:  Epigastric pain with right upper quadrant pain 4 days with nausea and vomiting. EXAM: CT ABDOMEN AND PELVIS WITH CONTRAST TECHNIQUE: Multidetector CT imaging of the abdomen and pelvis was performed using the standard protocol following bolus administration of intravenous contrast. CONTRAST:  ISOVUE-300 IOPAMIDOL (ISOVUE-300) INJECTION 61% COMPARISON:  None. FINDINGS: Lower chest: Minimal scarring over the medial inferior right middle lobe. Hepatobiliary: Moderate cholelithiasis present. 4 mm stone projected in the region of the gallbladder neck/cystic duct. Mild prominence of the common bowel duct measuring 7 mm. Liver is within normal.  The Pancreas: Pancreas is normal. There is a 2.5 mm stone over the distal common bile duct at the ampulla. Spleen: Within normal. Adrenals/Urinary Tract: Adrenal glands are normal. Kidneys are normal in size without hydronephrosis or nephrolithiasis. Ureters and bladder are within normal. Stomach/Bowel: Stomach and small bowel are within normal. Appendix is normal. Descending colon is decompressed as the colon is otherwise within normal. Vascular/Lymphatic: Within normal. Reproductive: Within normal. Other: Small amount of free fluid in the pelvis likely physiologic. Musculoskeletal: Within normal. IMPRESSION: Moderate cholelithiasis. 4 mm stone over the region of the gallbladder neck/ cystic duct. Additional small 2.5 mm stone over the distal common bile duct at the ampulla likely responsible for patient's pain. Mild associated dilatation of the common bile duct measuring 7 mm. Right upper quadrant ultrasound may be helpful for further evaluation to exclude acute cholecystitis. Electronically Signed   By: Marin Olp M.D.   On: 03/23/2016 14:31    Medications / Allergies: per chart  Antibiotics: Anti-infectives     None        Note: Portions of this report may have been transcribed using voice recognition software. Every effort was made to ensure accuracy; however, inadvertent computerized transcription errors may be present.   Any transcriptional errors that result from this process are unintentional.     [name], M.D., F.A.C.S. Gastrointestinal and Minimally Invasive Surgery [facility] [address] [place], [place] [address] [phone] Main / Paging   03/24/2016

## 2016-03-24 NOTE — Progress Notes (Signed)
[facility] PROGRESS NOTE  Erin Weaver [id number] DOB: [birth year] DOA: 03/23/2016 PCP: Vikki Ports, MD  Interim summary and HPI 50 with hx of migraine and depression. Admitted secondary to abd pain and nausea worse with food intake and also with dark urine and light stools. Found to have elevated bilirubin and LFT's. CT scan demonstrating cholelithiasis and choledocholithiasis. Patient admitted for further evaluation and treatment. GI and CCS on board.  Assessment/Plan: 1-cholelithiasis and choledocholithiasis  -LFT's trending down -no icterus or jaundice appreciated -plan is for cholecystectomy with intraoperative cholangiogram  -GI on stand by for potential ERCP if needed  -continue supportive care and PRN analgesics -remains NPO  2-migraine: -continue PRN tylenol for HA's  3-nausea: due to #1 -will continue PRN antiemetics   4-transaminitis  -due to #1 -will follow LFT's trend    5-depression -continue lexapro  Code Status:Full Family Communication: husband at bedside  Disposition Plan: home when medically stable; surgery later today with intraoperative cholangiogram and if base on results/needs ERCP   Consultants:  [facility]  GI  Procedures:  Planned cholecystectomy and intraoperative cholangiogram later today (11/7)  Antibiotics:  None   HPI/Subjective: Afebrile, complaining of nausea and HA. Currently w/o abd pain.  Objective: Vitals:   03/23/16 2154 03/24/16 0659  BP: 118/85 (!) 113/58  Pulse: 60 (!) 55  Resp: 19 19  Temp: 97.9 F (36.6 C) 97.9 F (36.6 C)    Intake/Output Summary (Last 24 hours) at 03/24/16 1102 Last data filed at 03/24/16 0606  Gross per 24 hour  Intake              670 ml  Output                0 ml  Net              670 ml   There were no vitals filed for this visit.  Exam:   General: afebrile, complaining of HA's and nausea. No CP, no SOB. reports no pain abd currently.  Cardiovascular: S1 and S2, no  rubs, no gallops  Respiratory: CTA bilaterally   Abdomen: soft, positive BS, no distension, minimal discomfort with deep palpation   Musculoskeletal: no edema, no cyanosis  Data Reviewed: Basic Metabolic Panel:  Recent Labs Lab 03/24/16 0625  NA 141  K 4.2  CL 106  CO2 25  GLUCOSE 77  BUN 6  CREATININE 0.78  CALCIUM 9.3   Liver Function Tests:  Recent Labs Lab 03/23/16 0920 03/24/16 0625  AST 257* 128*  ALT 430* 304*  ALKPHOS 170* 126  BILITOT 4.7* 2.0*  PROT 7.3 6.3*  ALBUMIN 4.5 3.8    Recent Labs Lab 03/24/16 0625  LIPASE 25   CBC:  Recent Labs Lab 03/23/16 0920 03/24/16 0625  WBC 4.0 4.2  NEUTROABS 2,240  --   HGB 13.1 12.4  HCT 37.3 36.9  MCV 90.1 92.3  PLT 246 202   Studies: Ct Abdomen Pelvis W Contrast  Result Date: 03/23/2016 CLINICAL DATA:  Epigastric pain with right upper quadrant pain 4 days with nausea and vomiting. EXAM: CT ABDOMEN AND PELVIS WITH CONTRAST TECHNIQUE: Multidetector CT imaging of the abdomen and pelvis was performed using the standard protocol following bolus administration of intravenous contrast. CONTRAST:  162mL ISOVUE-300 IOPAMIDOL (ISOVUE-300) INJECTION 61% COMPARISON:  None. FINDINGS: Lower chest: Minimal scarring over the medial inferior right middle lobe. Hepatobiliary: Moderate cholelithiasis present. 4 mm stone projected in the region of the gallbladder neck/cystic duct. Mild prominence of the  common bowel duct measuring 7 mm. Liver is within normal. The Pancreas: Pancreas is normal. There is a 2.5 mm stone over the distal common bile duct at the ampulla. Spleen: Within normal. Adrenals/Urinary Tract: Adrenal glands are normal. Kidneys are normal in size without hydronephrosis or nephrolithiasis. Ureters and bladder are within normal. Stomach/Bowel: Stomach and small bowel are within normal. Appendix is normal. Descending colon is decompressed as the colon is otherwise within normal. Vascular/Lymphatic: Within normal.  Reproductive: Within normal. Other: Small amount of free fluid in the pelvis likely physiologic. Musculoskeletal: Within normal. IMPRESSION: Moderate cholelithiasis. 4 mm stone over the region of the gallbladder neck/ cystic duct. Additional small 2.5 mm stone over the distal common bile duct at the ampulla likely responsible for patient's pain. Mild associated dilatation of the common bile duct measuring 7 mm. Right upper quadrant ultrasound may be helpful for further evaluation to exclude acute cholecystitis. Electronically Signed   By: Marin Olp M.D.   On: 03/23/2016 14:31    Scheduled Meds: . acetaminophen  1,000 mg Oral On Call to OR  . celecoxib  400 mg Oral On Call to OR  . Chlorhexidine Gluconate Cloth  6 each Topical Once   And  . Chlorhexidine Gluconate Cloth  6 each Topical Once  . enoxaparin (LOVENOX) injection  40 mg Subcutaneous Q24H  . gabapentin  300 mg Oral On Call to OR  . [START ON 03/25/2016] Influenza vac split quadrivalent PF  0.5 mL Intramuscular Tomorrow-1000  . lip balm   Topical BID  . vitamin C  500 mg Oral BID   Continuous Infusions: . lactated ringers 100 mL/hr at 03/24/16 1017    Principal Problem:   Common bile duct (CBD) obstruction Active Problems:   Elevated LFTs   Chronic cholecystitis with calculus   Choledocholithiasis    Time spent: 25 minutes    Barton Dubois  [facility] Pager [phone]. If 7PM-7AM, please contact night-coverage at [email or web address], password [facility] 03/24/2016, 11:02 AM  LOS: 1 day

## 2016-03-24 NOTE — Op Note (Signed)
03/24/2016  6:14 PM  PATIENT:  Erin Weaver  50 y.o. female  Patient Care Team: [name], MD as PCP - General (Family Medicine) Michael Boston, MD as Consulting Physician (General Surgery) Teena Irani, MD as Consulting Physician (Gastroenterology)  PRE-OPERATIVE DIAGNOSIS:   CHRONIC CHOLECYSTITIS WITH CALCULUS COMMON BILE DUCT STONE  POST-OPERATIVE DIAGNOSIS:    CHRONIC CHOLECYSTITIS WITH CALCULUS COMMON BILE DUCT STONE WITH DISTAL CBD OBSTRUCTION  PROCEDURE:  Procedure(s): LAPAROSCOPIC CHOLECYSTECTOMY SINGLE SITE WITH INTRAOPERATIVE CHOLANGIOGRAM  SURGEON:  Surgeon(s): Michael Boston, MD  ASSISTANT: RN   ANESTHESIA:   local and general  EBL:  Total I/O In: 1400 [I.V.:1400] Out: 73 [Blood:30]  Delay start of Pharmacological VTE agent (>24hrs) due to surgical blood loss or risk of bleeding:  no  DRAINS: none   SPECIMEN:  Source of Specimen:  Gallbladder   DISPOSITION OF SPECIMEN:  PATHOLOGY  COUNTS:  YES  PLAN OF CARE: Admit to inpatient   PATIENT DISPOSITION:  PACU - hemodynamically stable.  INDICATION: Patient with episodes of biliary colic in developing jaundice.  Found a persistent elevated liver function tests.  CT scan yesterday concerning for district, bile duct stone.  Admitted.  Liver function tests improved.  Pain less.  I offered laparoscopic cholecystectomy with cholangiogram.  Possibility of needing ERCP postop discussed.  The anatomy & physiology of hepatobiliary & pancreatic function was discussed.  The pathophysiology of gallbladder dysfunction was discussed.  Natural history risks without surgery was discussed.   I feel the risks of no intervention will lead to serious problems that outweigh the operative risks; therefore, I recommended cholecystectomy to remove the pathology.  I explained laparoscopic techniques with possible need for an open approach.  Probable cholangiogram to evaluate the bilary tract was explained as well.    Risks such as  bleeding, infection, abscess, leak, injury to other organs, need for further treatment, heart attack, death, and other risks were discussed.  I noted a good likelihood this will help address the problem.  Possibility that this will not correct all abdominal symptoms was explained.  Goals of post-operative recovery were discussed as well.  We will work to minimize complications.  An educational handout further explaining the pathology and treatment options was given as well.  Questions were answered.  The patient expresses understanding & wishes to proceed with surgery.   OR FINDINGS: Inflamed gallbladder with some adhesions consistent with chronic cholecystitis.  Cholangiogram with rather classic biliary anatomy.  Dilated biliary system with contrast going down to distal common bile duct.  Probable reverse meniscus sign.  No contrast into the duodenum despite flushing and intravenous glucagon.  Consistent with persistent obstructing choledocholithiasis.  DESCRIPTION:   The patient was identified & brought in the operating room. The patient was positioned supine with arms tucked. SCDs were active during the entire case. The patient underwent general anesthesia without any difficulty.  The abdomen was prepped and draped in a sterile fashion. A Surgical Timeout confirmed our plan.  I made a transverse curvilinear incision through the superior umbilical fold.  I placed a 72mm long port through the supraumbilical fascia using a modified Hassan cutdown technique. I began carbon dioxide insufflation. Camera inspection revealed no injury. There were no adhesions to the anterior abdominal wall supraumbilically.  I proceeded to continue with single site technique. I placed a #5 port in left upper aspect of the wound. I placed a 5 mm atraumatic grasper in the right inferior aspect of the wound.  I turned attention to the  right upper quadrant.  The gallbladder fundus was elevated cephalad.  I freed some adhesions of  greater omentum and duodenal sweep off the ventral surface of the liver and infundibulum.  Chronic cholecystitis.  Gallbladder wall with some edema as well.  I freed the peritoneal coverings between the gallbladder and the liver on the posteriolateral and anteriomedial walls. I alternated between Harmonic & blunt Maryland dissection to help get a good critical view of the cystic artery and cystic duct. I did further dissection to free a few centimeters of the  gallbladder off the liver bed to get a good critical view of the infundibulum and cystic duct. I mobilized the cystic artery; and, after getting a good 360 view, ligated the cystic artery branches using Harmonic ultrasonic dissection.  Dominant anterior branch.  Much more wispy posterior cystic arterial branch.  I skeletonized the cystic duct.  I placed a clip on the infundibulum. I did a partial cystic duct-otomy and ensured patency. I placed a 5 [place] cholangiocatheter through a puncture site at the right subcostal ridge of the abdominal wall and directed it into the cystic duct.  We ran a cholangiogram with dilute radio-opaque contrast and continuous fluoroscopy. Contrast flowed from a helical side branch consistent with cystic duct cannulization. Contrast flowed up the common hepatic duct into the right and left intrahepatic chains out to secondary radicals. Contrast flowed down the common bile duct with some resistance but gradually.  Contrast stopped in the distal common bile duct.  Suspicious for a reverse meniscus sign with a filling defect.  Concerning for persistent choledocholithiasis.  I flushed the bile duct.  Had some resistance.  Anesthesia gave 1 mg of glucagon intravenously.  After few minutes I reran the cholangiogram.  Persistent distal common bile duct obstruction.  No contrast in the duodenum.  Highly suspicious for distal obstructing choledocholithiasis.    I removed the cholangiocatheter. I placed clips on the cystic duct x4.  I  completed cystic duct transection.  I ligated the cystic duct stump with a 0 PDS Endoloop given some dilation and edema and inflammation. I freed the gallbladder from its remaining attachments to the liver. I ensured hemostasis on the gallbladder fossa of the liver and elsewhere. I inspected the rest of the abdomen & detected no injury nor bleeding elsewhere.  No spillage of bile nor stones.  Clips and PDS Endoloop and tacked on cystic duct stump.    I removed the gallbladder out the supraumbilical fascia inside an Endo Catch bag. I closed the fascia transversely using #1 PDS interrupted stitches.  The other puncture sites were sewn chose to with 0 Vicryl suture.  Thin abdominal wall within fascia.  Dozier good though.  I closed the skin using 4-0 monocryl stitch.  Sterile dressing was applied. The patient was extubated & arrived in the PACU in stable condition..  I had discussed postoperative care with the patient & her husband in the holding area. I am about to locate the patient's husband and discuss operative findings and postoperative goals / instructions.  I called discussed with Dr. Amedeo Plenty with [facility] gastroenterology.  He agrees ERCP will be needed.  He will try and ended on for tomorrow, but and may need to wait for the following day.  Instructions are written in the chart as well.  [name], M.D., F.A.C.S. Gastrointestinal and Minimally Invasive Surgery [facility] [address], [place], [place] [address] [phone] Main / Paging

## 2016-03-24 NOTE — Anesthesia Procedure Notes (Signed)
Procedure Name: Intubation Date/Time: 03/24/2016 4:51 PM Performed by: Garrison Columbus T Pre-anesthesia Checklist: Patient identified, Emergency Drugs available, Suction available and Patient being monitored Patient Re-evaluated:Patient Re-evaluated prior to inductionOxygen Delivery Method: Circle System Utilized Preoxygenation: Pre-oxygenation with 100% oxygen Intubation Type: IV induction and Rapid sequence Laryngoscope Size: Miller and 2 Grade View: Grade I Tube type: Oral Tube size: 7.5 mm Number of attempts: 1 Airway Equipment and Method: Stylet and Oral airway Placement Confirmation: ETT inserted through vocal cords under direct vision,  positive ETCO2 and breath sounds checked- equal and bilateral Secured at: 21 cm Tube secured with: Tape Dental Injury: Teeth and Oropharynx as per pre-operative assessment

## 2016-03-24 NOTE — Anesthesia Preprocedure Evaluation (Addendum)
Anesthesia Evaluation  Patient identified by MRN, date of birth, ID band Patient awake    Reviewed: Allergy & Precautions, NPO status , Patient's Chart, lab work & pertinent test results  History of Anesthesia Complications Negative for: history of anesthetic complications  Airway Mallampati: II  TM Distance: >3 FB Neck ROM: Full    Dental no notable dental hx. (+) Dental Advisory Given   Pulmonary neg pulmonary ROS,    Pulmonary exam normal        Cardiovascular negative cardio ROS Normal cardiovascular exam     Neuro/Psych negative neurological ROS  negative psych ROS   GI/Hepatic Neg liver ROS,   Endo/Other  negative endocrine ROS  Renal/GU negative Renal ROS  negative genitourinary   Musculoskeletal negative musculoskeletal ROS (+)   Abdominal   Peds negative pediatric ROS (+)  Hematology negative hematology ROS (+)   Anesthesia Other Findings   Reproductive/Obstetrics negative OB ROS                            Anesthesia Physical Anesthesia Plan  ASA: II  Anesthesia Plan: General   Post-op Pain Management:    Induction: Intravenous, Rapid sequence and Cricoid pressure planned  Airway Management Planned: Oral ETT  Additional Equipment:   Intra-op Plan:   Post-operative Plan: Extubation in OR  Informed Consent: I have reviewed the patients History and Physical, chart, labs and discussed the procedure including the risks, benefits and alternatives for the proposed anesthesia with the patient or authorized representative who has indicated his/her understanding and acceptance.   Dental advisory given  Plan Discussed with: CRNA, Anesthesiologist and Surgeon  Anesthesia Plan Comments:        Anesthesia Quick Evaluation

## 2016-03-24 NOTE — Anesthesia Postprocedure Evaluation (Signed)
Anesthesia Post Note  Patient: Erin Weaver  Procedure(s) Performed: Procedure(s) (LRB): LAPAROSCOPIC CHOLECYSTECTOMY SINGLE SITE WITH INTRAOPERATIVE CHOLANGIOGRAM (N/A)  Patient location during evaluation: PACU Anesthesia Type: General Level of consciousness: sedated Pain management: pain level controlled Vital Signs Assessment: post-procedure vital signs reviewed and stable Respiratory status: spontaneous breathing and respiratory function stable Cardiovascular status: stable Anesthetic complications: no    Last Vitals:  Vitals:   03/24/16 1906 03/24/16 1915  BP:  102/60  Pulse: 60 80  Resp: 18 (!) 33  Temp:  36.5 C    Last Pain:  Vitals:   03/24/16 1900  TempSrc:   PainSc: [place]

## 2016-03-25 ENCOUNTER — Encounter (HOSPITAL_COMMUNITY): Payer: Self-pay | Admitting: Certified Registered Nurse Anesthetist

## 2016-03-25 ENCOUNTER — Encounter (HOSPITAL_COMMUNITY)
Admission: EM | Disposition: A | Discharge status: Home / Self Care | Payer: Self-pay | Source: Home / Self Care | Attending: Internal Medicine

## 2016-03-25 ENCOUNTER — Encounter (HOSPITAL_COMMUNITY): Payer: Self-pay | Admitting: Surgery

## 2016-03-25 DIAGNOSIS — K831 Obstruction of bile duct: Secondary | ICD-10-CM

## 2016-03-25 LAB — POTASSIUM: POTASSIUM: 4.3 mmol/L (ref 3.5–5.1)

## 2016-03-25 LAB — HEPATIC FUNCTION PANEL
ALBUMIN: 3.3 g/dL — AB (ref 3.5–5.0)
ALT: 277 U/L — AB (ref 14–54)
AST: 171 U/L — AB (ref 15–41)
Alkaline Phosphatase: 124 U/L (ref 38–126)
BILIRUBIN DIRECT: 1.7 mg/dL — AB (ref 0.1–0.5)
BILIRUBIN TOTAL: 3.2 mg/dL — AB (ref 0.3–1.2)
Indirect Bilirubin: 1.5 mg/dL — ABNORMAL HIGH (ref 0.3–0.9)
Total Protein: 5.6 g/dL — ABNORMAL LOW (ref 6.5–8.1)

## 2016-03-25 LAB — CREATININE, SERUM
Creatinine, Ser: 0.89 mg/dL (ref 0.44–1.00)
GFR calc non Af Amer: >60 mL/min (ref >60)

## 2016-03-25 LAB — LIPASE, BLOOD: Lipase: 21 U/L (ref 11–51)

## 2016-03-25 SURGERY — CANCELLED PROCEDURE

## 2016-03-25 MED ORDER — FENTANYL CITRATE (PF) 100 MCG/2ML IJ SOLN
25.0000 ug | INTRAMUSCULAR | Status: DC | PRN
  Administered 2016-03-25 – 2016-03-26 (×2): 25 ug via INTRAVENOUS
  Filled 2016-03-25 (×2): qty 2

## 2016-03-25 MED ORDER — LACTATED RINGERS IV BOLUS (SEPSIS): 1000.0000 mL | Freq: Three times a day (TID) | INTRAVENOUS | Status: AC | PRN

## 2016-03-25 MED ORDER — SODIUM CHLORIDE 0.9 % IV SOLN: 250.0000 mL | INTRAVENOUS | Status: DC | PRN

## 2016-03-25 MED ORDER — CIPROFLOXACIN IN D5W 400 MG/200ML IV SOLN
INTRAVENOUS | Status: AC
  Filled 2016-03-25: qty 200

## 2016-03-25 MED ORDER — SODIUM CHLORIDE 0.9% FLUSH
3.0000 mL | Freq: Two times a day (BID) | INTRAVENOUS | Status: DC
  Administered 2016-03-27: 3 mL via INTRAVENOUS

## 2016-03-25 MED ORDER — SODIUM CHLORIDE 0.9 % IV SOLN
INTRAVENOUS | Status: DC
  Administered 2016-03-25 – 2016-03-26 (×2): via INTRAVENOUS

## 2016-03-25 MED ORDER — SODIUM CHLORIDE 0.9 % IV SOLN: INTRAVENOUS | Status: DC

## 2016-03-25 MED ORDER — ACETAMINOPHEN 500 MG PO TABS
1000.0000 mg | ORAL_TABLET | Freq: Three times a day (TID) | ORAL | Status: DC
  Administered 2016-03-25 – 2016-03-27 (×6): 1000 mg via ORAL
  Filled 2016-03-25 (×7): qty 2

## 2016-03-25 MED ORDER — CIPROFLOXACIN IN D5W 400 MG/200ML IV SOLN
400.0000 mg | Freq: Once | INTRAVENOUS | Status: DC
  Filled 2016-03-25: qty 200

## 2016-03-25 MED ORDER — SODIUM CHLORIDE 0.9% FLUSH: 3.0000 mL | INTRAVENOUS | Status: DC | PRN

## 2016-03-25 MED ORDER — HYDROMORPHONE HCL 1 MG/ML IJ SOLN: 0.5000 mg | INTRAMUSCULAR | Status: DC | PRN

## 2016-03-25 NOTE — Progress Notes (Signed)
Patient admitted to Endo unit for add on ERCP. Questioned patient about NPO status. Patient said she drank 8oouces of water and approximently 8ounces of cranberry juice by 1040 am. Verified with anesthesiologist and case needs to be held until 1430 or after due to amount of liquids consumed. Md notified and decision made to move ERCP to tomorrow (11.8.17) @ 10am.

## 2016-03-25 NOTE — Anesthesia Preprocedure Evaluation (Addendum)
Anesthesia Evaluation  Patient identified by MRN, date of birth, ID band Patient awake    Reviewed: Allergy & Precautions, H&P , NPO status , Patient's Chart, lab work & pertinent test results  Airway Mallampati: II       Dental no notable dental hx. (+) Teeth Intact, Dental Advisory Given   Pulmonary neg pulmonary ROS,    Pulmonary exam normal breath sounds clear to auscultation- rhonchi       Cardiovascular negative cardio ROS   Rhythm:Regular Rate:Normal     Neuro/Psych  Headaches, negative psych ROS   GI/Hepatic negative GI ROS, Neg liver ROS,   Endo/Other  negative endocrine ROS  Renal/GU negative Renal ROS  negative genitourinary   Musculoskeletal   Abdominal   Peds  Hematology negative hematology ROS (+)   Anesthesia Other Findings   Reproductive/Obstetrics negative OB ROS                            Anesthesia Physical Anesthesia Plan  ASA: II  Anesthesia Plan: General   Post-op Pain Management:    Induction: Intravenous, Rapid sequence and Cricoid pressure planned  Airway Management Planned: Oral ETT  Additional Equipment:   Intra-op Plan:   Post-operative Plan: Extubation in OR  Informed Consent: I have reviewed the patients History and Physical, chart, labs and discussed the procedure including the risks, benefits and alternatives for the proposed anesthesia with the patient or authorized representative who has indicated his/her understanding and acceptance.   Dental advisory given  Plan Discussed with: CRNA  Anesthesia Plan Comments:        Anesthesia Quick Evaluation

## 2016-03-25 NOTE — Progress Notes (Signed)
[facility]  [address] [place], [place] [address] Phone: [phone] FAX: [phone]   KALLEN DELATORRE [id number] [birth year]  CARE TEAM:  PCP: Vikki Ports, MD  Outpatient Care Team: Patient Care Team: [name], MD as PCP - General (Family Medicine) Michael Boston, MD as Consulting Physician (General Surgery) Teena Irani, MD as Consulting Physician (Gastroenterology)  Inpatient Treatment Team: Treatment Team: Attending Provider: Theodis Blaze, MD; Consulting Physician: Michael Boston, MD; Consulting Physician: Wonda Horner, MD; Registered Nurse: Roselind Rily, RN; Rounding Team: Minerva Ends, MD; Registered Nurse: Ellouise Newer, RN; Consulting Physician: Teena Irani, MD; Registered Nurse: Kennith Center, RN  Problem List:   Principal Problem:   Common bile duct (CBD) obstruction Active Problems:   Elevated LFTs   Chronic cholecystitis with calculus   Choledocholithiasis   1 Day Post-Op  03/24/2016  POST-OPERATIVE DIAGNOSIS:    CHRONIC CHOLECYSTITIS WITH CALCULUS COMMON BILE DUCT STONE WITH DISTAL CBD OBSTRUCTION  PROCEDURE:  Procedure(s): LAPAROSCOPIC CHOLECYSTECTOMY SINGLE SITE WITH INTRAOPERATIVE CHOLANGIOGRAM  SURGEON:  Surgeon(s): Michael Boston, MD  OR FINDINGS: Inflamed gallbladder with some adhesions consistent with chronic cholecystitis.  Cholangiogram with rather classic biliary anatomy.  Dilated biliary system with contrast going down to distal common bile duct.  Probable reverse meniscus sign.  No contrast into the duodenum despite flushing and intravenous glucagon.  Consistent with persistent obstructing choledocholithiasis.    Assessment  CBD stone on IOC s/p lap chole  Plan:  -IOC+, will need ERCP this admission.  Later today vs tomorrow when GI can get availability.  D/w Dr. Amedeo Plenty with [facility] -Clears until 11AM OK, then NPO for probable ERCP this afternoon -IVF boluses PRN   -VTE prophylaxis- SCDs, etc -mobilize as tolerated to help recovery  [name], M.D., F.A.C.S. Gastrointestinal and Minimally Invasive Surgery [facility] [address] [place], [place] [address] [phone] Main / Paging   03/25/2016  Subjective:  Pain controlled Sleepy Dry mouth Walked to bathroom Husband at bedside   Objective:  Vital signs:  Vitals:   03/24/16 1915 03/24/16 1950 03/25/16 0337 03/25/16 0500  BP: 102/60 (!) 98/58 (!) 108/55   Pulse: 80 91 79   Resp: (!) 33 19 20   Temp: 97.7 F (36.5 C) 97.8 F (36.6 C) 98.1 F (36.7 C)   TempSrc:  Axillary Axillary   SpO2: 100% 98% 100%   Weight:    50.4 kg (111 lb 3.2 oz)  Height:    '5\' 2"'  (1.575 m)    Last BM Date: 03/23/16  Intake/Output   Yesterday:  11/07 0701 - 11/08 0700 In: 1500 [P.O.:100; I.V.:1400] Out: 30 [Blood:30] This shift:  Total I/O In: 100 [P.O.:100] Out: -   Bowel function:  Flatus: YES  BM:  YES  Drain: (No drain)   Physical Exam:  General: Pt awake/alert/oriented x4 in No acute distress Eyes: PERRL, normal EOM.  Sclera clear.  No icterus Neuro: CN II-XII intact w/o focal sensory/motor deficits. Lymph: No head/neck/groin lymphadenopathy Psych:  No delerium/psychosis/paranoia HENT: Normocephalic, Mucus membranes moist.  No thrush Neck: Supple, No tracheal deviation Chest: No chest wall pain w good excursion CV:  Pulses intact.  Regular rhythm MS: Normal AROM mjr joints.  No obvious deformity Abdomen: Soft.  Nondistended.  Mildly tender at incision only.  Dressing c/d/i.  No evidence of peritonitis.  No incarcerated hernias. Ext:  SCDs BLE.  No mjr edema.  No cyanosis Skin: No  petechiae / purpura.  Less Jaundiced  Results:   Labs: Results for orders placed or performed during the hospital encounter of 03/23/16 (from the past 48 hour(s))  Comprehensive metabolic panel     Status: Abnormal   Collection Time: 03/24/16  6:25  AM  Result Value Ref Range   Sodium 141 135 - 145 mmol/L   Potassium 4.2 3.5 - 5.1 mmol/L   Chloride 106 101 - 111 mmol/L   CO2 25 22 - 32 mmol/L   Glucose, Bld 77 65 - 99 mg/dL   BUN 6 6 - 20 mg/dL   Creatinine, Ser 0.78 0.44 - 1.00 mg/dL   Calcium 9.3 8.9 - 10.3 mg/dL   Total Protein 6.3 (L) 6.5 - 8.1 g/dL   Albumin 3.8 3.5 - 5.0 g/dL   AST 128 (H) 15 - 41 U/L   ALT 304 (H) 14 - 54 U/L   Alkaline Phosphatase 126 38 - 126 U/L   Total Bilirubin 2.0 (H) 0.3 - 1.2 mg/dL   GFR calc non Af Amer >60 >60 mL/min   GFR calc Af Amer >60 >60 mL/min    Comment: (NOTE) The eGFR has been calculated using the CKD EPI equation. This calculation has not been validated in all clinical situations. eGFR's persistently <60 mL/min signify possible Chronic Kidney Disease.    Anion gap 10 5 - 15  Lipase, blood     Status: None   Collection Time: 03/24/16  6:25 AM  Result Value Ref Range   Lipase 25 11 - 51 U/L  CBC     Status: None   Collection Time: 03/24/16  6:25 AM  Result Value Ref Range   WBC 4.2 4.0 - 10.5 K/uL   RBC 4.00 3.87 - 5.11 MIL/uL   Hemoglobin 12.4 12.0 - 15.0 g/dL   HCT 36.9 36.0 - 46.0 %   MCV 92.3 78.0 - 100.0 fL   MCH 31.0 26.0 - 34.0 pg   MCHC 33.6 30.0 - 36.0 g/dL   RDW 13.0 11.5 - 15.5 %   Platelets 202 150 - 400 K/uL  MRSA PCR Screening     Status: None   Collection Time: 03/24/16 10:56 AM  Result Value Ref Range   MRSA by PCR NEGATIVE NEGATIVE    Comment:        The GeneXpert MRSA Assay (FDA approved for NASAL specimens only), is one component of a comprehensive MRSA colonization surveillance program. It is not intended to diagnose MRSA infection nor to guide or monitor treatment for MRSA infections.   I-Stat beta hCG blood, ED     Status: None   Collection Time: 03/24/16  2:59 PM  Result Value Ref Range   I-stat hCG, quantitative <5.0 <5 mIU/mL   Comment 3            Comment:   GEST. AGE      CONC.  (mIU/mL)   <=1 WEEK        5 - 50     2 WEEKS        50 - 500     3 WEEKS       100 - 10,000     4 WEEKS     1,000 - 30,000        FEMALE AND NON-PREGNANT FEMALE:     LESS THAN 5 mIU/mL   Hepatic function panel     Status: Abnormal   Collection Time: 03/25/16  6:42 AM  Result Value Ref Range  Total Protein 5.6 (L) 6.5 - 8.1 g/dL   Albumin 3.3 (L) 3.5 - 5.0 g/dL   AST 171 (H) 15 - 41 U/L   ALT 277 (H) 14 - 54 U/L   Alkaline Phosphatase 124 38 - 126 U/L   Total Bilirubin 3.2 (H) 0.3 - 1.2 mg/dL   Bilirubin, Direct 1.7 (H) 0.1 - 0.5 mg/dL   Indirect Bilirubin 1.5 (H) 0.3 - 0.9 mg/dL  Lipase, blood     Status: None   Collection Time: 03/25/16  6:42 AM  Result Value Ref Range   Lipase 21 11 - 51 U/L  Potassium     Status: None   Collection Time: 03/25/16  6:42 AM  Result Value Ref Range   Potassium 4.3 3.5 - 5.1 mmol/L  Creatinine, serum     Status: None   Collection Time: 03/25/16  6:42 AM  Result Value Ref Range   Creatinine, Ser 0.89 0.44 - 1.00 mg/dL   GFR calc non Af Amer >60 >60 mL/min   GFR calc Af Amer >60 >60 mL/min    Comment: (NOTE) The eGFR has been calculated using the CKD EPI equation. This calculation has not been validated in all clinical situations. eGFR's persistently <60 mL/min signify possible Chronic Kidney Disease.     Imaging / Studies: Ct Abdomen Pelvis W Contrast  Result Date: 03/23/2016 CLINICAL DATA:  Epigastric pain with right upper quadrant pain 4 days with nausea and vomiting. EXAM: CT ABDOMEN AND PELVIS WITH CONTRAST TECHNIQUE: Multidetector CT imaging of the abdomen and pelvis was performed using the standard protocol following bolus administration of intravenous contrast. CONTRAST:  143m ISOVUE-300 IOPAMIDOL (ISOVUE-300) INJECTION 61% COMPARISON:  None. FINDINGS: Lower chest: Minimal scarring over the medial inferior right middle lobe. Hepatobiliary: Moderate cholelithiasis present. 4 mm stone projected in the region of the gallbladder neck/cystic duct. Mild prominence of the common bowel duct  measuring 7 mm. Liver is within normal. The Pancreas: Pancreas is normal. There is a 2.5 mm stone over the distal common bile duct at the ampulla. Spleen: Within normal. Adrenals/Urinary Tract: Adrenal glands are normal. Kidneys are normal in size without hydronephrosis or nephrolithiasis. Ureters and bladder are within normal. Stomach/Bowel: Stomach and small bowel are within normal. Appendix is normal. Descending colon is decompressed as the colon is otherwise within normal. Vascular/Lymphatic: Within normal. Reproductive: Within normal. Other: Small amount of free fluid in the pelvis likely physiologic. Musculoskeletal: Within normal. IMPRESSION: Moderate cholelithiasis. 4 mm stone over the region of the gallbladder neck/ cystic duct. Additional small 2.5 mm stone over the distal common bile duct at the ampulla likely responsible for patient's pain. Mild associated dilatation of the common bile duct measuring 7 mm. Right upper quadrant ultrasound may be helpful for further evaluation to exclude acute cholecystitis. Electronically Signed   By: DMarin OlpM.D.   On: 03/23/2016 14:31    Medications / Allergies: per chart  Antibiotics: Anti-infectives    None        Note: Portions of this report may have been transcribed using voice recognition software. Every effort was made to ensure accuracy; however, inadvertent computerized transcription errors may be present.   Any transcriptional errors that result from this process are unintentional.     SAdin Hector M.D., F.A.C.S. Gastrointestinal and Minimally Invasive Surgery [facility] [address] [place] [phone] / Paging   03/25/2016

## 2016-03-25 NOTE — Progress Notes (Signed)
Pt had clear liquids for breakfast then kept NPO. Abd pain was relieved with Robaxin this morning. Pt was ambulating to the hall. 47 Endo came for pt to have ERCP done.  1430 Pt came back to the unit, ERCP was cancelled.

## 2016-03-25 NOTE — Progress Notes (Signed)
[facility] PROGRESS NOTE  Erin Weaver [id number] DOB: 1966-05-17 DOA: 03/23/2016   PCP: Vikki Ports, MD  Interim summary and HPI 22 with admitted secondary to abd pain and nausea worse with food intake and also with dark urine and light stools. Found to have elevated bilirubin and LFT's. CT scan demonstrating cholelithiasis and choledocholithiasis. Patient admitted for further evaluation and treatment. GI and CCS on board.  Assessment/Plan: Cholelithiasis and choledocholithiasis, transaminitis  - still with significant pain, requiring fentanyl IV Q 2-3 hours - plan for ERCP today or tomorrow - OK for clears until 11 am and then NPO  - OK to use antiemetics as needed  - CMET In AM  Migraine - continue PRN tylenol for HA'  Depression - continue lexapro  Code Status:Full Family Communication: husband at bedside  Disposition Plan: home when surgery and GI team clear    Consultants:  CCS  GI  Procedures:  Cholecystectomy and intraoperative cholangiogram 11/07  ERCP 11/08 or 11/09 planned  Antibiotics:  None   HPI/Subjective: Afebrile, complaining of nausea and RUQ pain.   Objective: Vitals:   03/25/16 0337 03/25/16 1134  BP: (!) 108/55 (!) 104/56  Pulse: 79 63  Resp: 20   Temp: 98.1 F (36.7 C) 97.9 F (36.6 C)    Intake/Output Summary (Last 24 hours) at 03/25/16 1135 Last data filed at 03/25/16 0542  Gross per 24 hour  Intake             1500 ml  Output               30 ml  Net             1470 ml   Filed Weights   03/25/16 0500  Weight: 50.4 kg (111 lb 3.2 oz)    Exam:   General: afebrile, complaining of HA's and nausea. No CP, no SOB. reports no pain abd currently.  Cardiovascular: S1 and S2, no rubs, no gallops  Respiratory: CTA bilaterally   Abdomen: soft, positive BS, no distension, moderate discomfort with light palpation   Musculoskeletal: no edema, no cyanosis  Data Reviewed: Basic Metabolic Panel:  Recent  Labs Lab 03/24/16 0625 03/25/16 0642  NA 141  --   K 4.2 4.3  CL 106  --   CO2 25  --   GLUCOSE 77  --   BUN 6  --   CREATININE 0.78 0.89  CALCIUM 9.3  --    Liver Function Tests:  Recent Labs Lab 03/23/16 0920 03/24/16 0625 03/25/16 0642  AST 257* 128* 171*  ALT 430* 304* 277*  ALKPHOS 170* 126 124  BILITOT 4.7* 2.0* 3.2*  PROT 7.3 6.3* 5.6*  ALBUMIN 4.5 3.8 3.3*    Recent Labs Lab 03/24/16 0625 03/25/16 0642  LIPASE 25 21   CBC:  Recent Labs Lab 03/23/16 0920 03/24/16 0625  WBC 4.0 4.2  NEUTROABS 2,240  --   HGB 13.1 12.4  HCT 37.3 36.9  MCV 90.1 92.3  PLT 246 202   Studies: Dg Cholangiogram Operative  Result Date: 03/25/2016 CLINICAL DATA:  Laparoscopic cholecystectomy.  Cholelithiasis. EXAM: INTRAOPERATIVE CHOLANGIOGRAM TECHNIQUE: Cholangiographic images from the C-arm fluoroscopic device were submitted for interpretation post-operatively. Please see the procedural report for the amount of contrast and the fluoroscopy time utilized. COMPARISON:  CT 03/23/2016 FINDINGS: Contrast opacification of the intrahepatic and extrahepatic biliary system. There is no drainage into the duodenum. Findings compatible with an obstructing lesion in the distal common bile duct. IMPRESSION: Obstruction  in the distal common bile duct. Based on the prior CT, this is most compatible with an obstructing biliary stone. Electronically Signed   By: Markus Daft M.D.   On: 03/25/2016 07:19   Ct Abdomen Pelvis W Contrast  Result Date: 03/23/2016 CLINICAL DATA:  Epigastric pain with right upper quadrant pain 4 days with nausea and vomiting. EXAM: CT ABDOMEN AND PELVIS WITH CONTRAST TECHNIQUE: Multidetector CT imaging of the abdomen and pelvis was performed using the standard protocol following bolus administration of intravenous contrast. CONTRAST:  158mL ISOVUE-300 IOPAMIDOL (ISOVUE-300) INJECTION 61% COMPARISON:  None. FINDINGS: Lower chest: Minimal scarring over the medial inferior  right middle lobe. Hepatobiliary: Moderate cholelithiasis present. 4 mm stone projected in the region of the gallbladder neck/cystic duct. Mild prominence of the common bowel duct measuring 7 mm. Liver is within normal. The Pancreas: Pancreas is normal. There is a 2.5 mm stone over the distal common bile duct at the ampulla. Spleen: Within normal. Adrenals/Urinary Tract: Adrenal glands are normal. Kidneys are normal in size without hydronephrosis or nephrolithiasis. Ureters and bladder are within normal. Stomach/Bowel: Stomach and small bowel are within normal. Appendix is normal. Descending colon is decompressed as the colon is otherwise within normal. Vascular/Lymphatic: Within normal. Reproductive: Within normal. Other: Small amount of free fluid in the pelvis likely physiologic. Musculoskeletal: Within normal. IMPRESSION: Moderate cholelithiasis. 4 mm stone over the region of the gallbladder neck/ cystic duct. Additional small 2.5 mm stone over the distal common bile duct at the ampulla likely responsible for patient's pain. Mild associated dilatation of the common bile duct measuring 7 mm. Right upper quadrant ultrasound may be helpful for further evaluation to exclude acute cholecystitis. Electronically Signed   By: Marin Olp M.D.   On: 03/23/2016 14:31    Scheduled Meds: . acetaminophen  1,000 mg Oral TID  . escitalopram  10 mg Oral Daily  . Influenza vac split quadrivalent PF  0.5 mL Intramuscular Tomorrow-1000  . lip balm   Topical BID  . scopolamine  1 patch Transdermal Q72H  . sodium chloride flush  3 mL Intravenous Q12H  . vitamin C  500 mg Oral BID   Continuous Infusions:  Time spent: 25 minutes  MAGICK-Shazia Mitchener  [facility]  Pager [phone]. If 7PM-7AM, please contact night-coverage at [email or web address], password [facility] 03/25/2016, 11:35 AM  LOS: 2 days

## 2016-03-26 ENCOUNTER — Inpatient Hospital Stay (HOSPITAL_COMMUNITY): Payer: 59

## 2016-03-26 ENCOUNTER — Inpatient Hospital Stay (HOSPITAL_COMMUNITY): Payer: 59 | Admitting: Anesthesiology

## 2016-03-26 ENCOUNTER — Encounter (HOSPITAL_COMMUNITY): Payer: Self-pay | Admitting: *Deleted

## 2016-03-26 ENCOUNTER — Encounter (HOSPITAL_COMMUNITY)
Admission: EM | Disposition: A | Discharge status: Home / Self Care | Payer: Self-pay | Source: Home / Self Care | Attending: Internal Medicine

## 2016-03-26 HISTORY — PX: ERCP: SHX5425

## 2016-03-26 LAB — CBC
HEMATOCRIT: 32.3 % — AB (ref 36.0–46.0)
HEMOGLOBIN: 10.9 g/dL — AB (ref 12.0–15.0)
MCH: 31.2 pg (ref 26.0–34.0)
MCHC: 33.7 g/dL (ref 30.0–36.0)
MCV: 92.6 fL (ref 78.0–100.0)
Platelets: 193 10*3/uL (ref 150–400)
RBC: 3.49 MIL/uL — AB (ref 3.87–5.11)
RDW: 13 % (ref 11.5–15.5)
WBC: 5.6 10*3/uL (ref 4.0–10.5)

## 2016-03-26 LAB — COMPREHENSIVE METABOLIC PANEL
ALBUMIN: 3.2 g/dL — AB (ref 3.5–5.0)
ALT: 328 U/L — ABNORMAL HIGH (ref 14–54)
ANION GAP: 6 (ref 5–15)
AST: 288 U/L — ABNORMAL HIGH (ref 15–41)
Alkaline Phosphatase: 128 U/L — ABNORMAL HIGH (ref 38–126)
BILIRUBIN TOTAL: 3.3 mg/dL — AB (ref 0.3–1.2)
BUN: <5 mg/dL — ABNORMAL LOW (ref 6–20)
CALCIUM: 8.2 mg/dL — AB (ref 8.9–10.3)
CO2: 25 mmol/L (ref 22–32)
Chloride: 107 mmol/L (ref 101–111)
Creatinine, Ser: 0.7 mg/dL (ref 0.44–1.00)
GFR calc non Af Amer: >60 mL/min (ref >60)
GLUCOSE: 137 mg/dL — AB (ref 65–99)
POTASSIUM: 3.8 mmol/L (ref 3.5–5.1)
Sodium: 138 mmol/L (ref 135–145)
TOTAL PROTEIN: 5.4 g/dL — AB (ref 6.5–8.1)

## 2016-03-26 SURGERY — ERCP, WITH INTERVENTION IF INDICATED
Anesthesia: General

## 2016-03-26 MED ORDER — CIPROFLOXACIN IN D5W 400 MG/200ML IV SOLN
INTRAVENOUS | Status: DC | PRN
  Administered 2016-03-26: 400 mg via INTRAVENOUS

## 2016-03-26 MED ORDER — FENTANYL CITRATE (PF) 100 MCG/2ML IJ SOLN
INTRAMUSCULAR | Status: DC | PRN
  Administered 2016-03-26: 100 ug via INTRAVENOUS

## 2016-03-26 MED ORDER — GUAIFENESIN ER 600 MG PO TB12
600.0000 mg | ORAL_TABLET | Freq: Once | ORAL | Status: AC
  Administered 2016-03-26: 600 mg via ORAL
  Filled 2016-03-26: qty 1

## 2016-03-26 MED ORDER — INDOMETHACIN 50 MG RE SUPP
RECTAL | Status: DC | PRN
  Administered 2016-03-26: 100 mg via RECTAL

## 2016-03-26 MED ORDER — CIPROFLOXACIN IN D5W 400 MG/200ML IV SOLN
INTRAVENOUS | Status: AC
  Filled 2016-03-26: qty 200

## 2016-03-26 MED ORDER — LACTATED RINGERS IV SOLN
INTRAVENOUS | Status: DC
  Administered 2016-03-26: 10:00:00 via INTRAVENOUS

## 2016-03-26 MED ORDER — ONDANSETRON HCL 4 MG/2ML IJ SOLN
INTRAMUSCULAR | Status: DC | PRN
  Administered 2016-03-26: 4 mg via INTRAVENOUS

## 2016-03-26 MED ORDER — LIDOCAINE HCL (CARDIAC) 20 MG/ML IV SOLN
INTRAVENOUS | Status: DC | PRN
  Administered 2016-03-26: 60 mg via INTRATRACHEAL

## 2016-03-26 MED ORDER — INDOMETHACIN 50 MG RE SUPP
RECTAL | Status: AC
  Filled 2016-03-26: qty 1

## 2016-03-26 MED ORDER — EPHEDRINE SULFATE-NACL 50-0.9 MG/10ML-% IV SOSY
PREFILLED_SYRINGE | INTRAVENOUS | Status: DC | PRN
Start: 2016-03-26 — End: 2016-03-26
  Administered 2016-03-26: 5 mg via INTRAVENOUS
  Administered 2016-03-26: 10 mg via INTRAVENOUS
  Administered 2016-03-26: 5 mg via INTRAVENOUS

## 2016-03-26 MED ORDER — ROCURONIUM BROMIDE 10 MG/ML (PF) SYRINGE
PREFILLED_SYRINGE | INTRAVENOUS | Status: DC | PRN
  Administered 2016-03-26: 10 mg via INTRAVENOUS
  Administered 2016-03-26: 30 mg via INTRAVENOUS

## 2016-03-26 MED ORDER — LACTATED RINGERS IV SOLN
INTRAVENOUS | Status: DC | PRN
  Administered 2016-03-26: 09:00:00 via INTRAVENOUS

## 2016-03-26 MED ORDER — SUCCINYLCHOLINE CHLORIDE 20 MG/ML IJ SOLN
INTRAMUSCULAR | Status: DC | PRN
  Administered 2016-03-26: 80 mg via INTRAVENOUS

## 2016-03-26 MED ORDER — PROPOFOL 10 MG/ML IV BOLUS
INTRAVENOUS | Status: DC | PRN
  Administered 2016-03-26: 110 mg via INTRAVENOUS

## 2016-03-26 MED ORDER — SUGAMMADEX SODIUM 200 MG/2ML IV SOLN
INTRAVENOUS | Status: DC | PRN
  Administered 2016-03-26: 100 mg via INTRAVENOUS

## 2016-03-26 MED ORDER — FENTANYL CITRATE (PF) 100 MCG/2ML IJ SOLN
25.0000 ug | INTRAMUSCULAR | Status: DC | PRN
  Administered 2016-03-26: 50 ug via INTRAVENOUS
  Filled 2016-03-26: qty 2

## 2016-03-26 MED ORDER — SODIUM CHLORIDE 0.9 % IV SOLN
INTRAVENOUS | Status: DC | PRN
  Administered 2016-03-26: 36 mL

## 2016-03-26 MED ORDER — MIDAZOLAM HCL 5 MG/5ML IJ SOLN
INTRAMUSCULAR | Status: DC | PRN
  Administered 2016-03-26: 2 mg via INTRAVENOUS

## 2016-03-26 MED ORDER — GLUCAGON HCL RDNA (DIAGNOSTIC) 1 MG IJ SOLR
INTRAMUSCULAR | Status: AC
  Filled 2016-03-26: qty 1

## 2016-03-26 MED ORDER — IOPAMIDOL (ISOVUE-300) INJECTION 61%
INTRAVENOUS | Status: AC
  Filled 2016-03-26: qty 50

## 2016-03-26 NOTE — Progress Notes (Signed)
[facility]  [address] [place], [place] [address] Phone: [phone] FAX: [phone]   Erin Weaver [id number] [birth year]  CARE TEAM:  PCP: Vikki Ports, MD  Outpatient Care Team: Patient Care Team: [name], MD as PCP - General (Family Medicine) Michael Boston, MD as Consulting Physician (General Surgery) Teena Irani, MD as Consulting Physician (Gastroenterology)  Inpatient Treatment Team: Treatment Team: Attending Provider: Theodis Blaze, MD; Consulting Physician: Michael Boston, MD; Consulting Physician: Wonda Horner, MD; Registered Nurse: Roselind Rily, RN; Rounding Team: Minerva Ends, MD; Registered Nurse: Ellouise Newer, RN; Consulting Physician: Teena Irani, MD; Registered Nurse: Kennith Center, RN; Technician: Lindajo Royal, NT; Technician: Nino Glow, NT  Problem List:   Principal Problem:   Common bile duct (CBD) obstruction Active Problems:   Elevated LFTs   Acute on chronic cholecystitis s/p lap cholecystectomy 03/24/2016   Choledocholithiasis   1 Day Post-Op  03/24/2016  POST-OPERATIVE DIAGNOSIS:    CHRONIC CHOLECYSTITIS WITH CALCULUS COMMON BILE DUCT STONE WITH DISTAL CBD OBSTRUCTION  PROCEDURE:  Procedure(s): LAPAROSCOPIC CHOLECYSTECTOMY SINGLE SITE WITH INTRAOPERATIVE CHOLANGIOGRAM  SURGEON:  Surgeon(s): Michael Boston, MD  OR FINDINGS: Inflamed gallbladder with some adhesions consistent with chronic cholecystitis.  Cholangiogram with rather classic biliary anatomy.  Dilated biliary system with contrast going down to distal common bile duct.  Probable reverse meniscus sign.  No contrast into the duodenum despite flushing and intravenous glucagon.  Consistent with persistent obstructing choledocholithiasis.    Assessment  CBD stone on IOC s/p lap chole  Plan:  -IOC+, will need ERCP this admission.  Cancelled yesterday - for this AM -NPO for ERCP -IVF boluses PRN  -pain  control -VTE prophylaxis- SCDs, etc -mobilize as tolerated to help recovery  [name], M.D., F.A.C.S. Gastrointestinal and Minimally Invasive Surgery [facility] [address] [place], [place] [address] [phone] Main / Paging   03/26/2016  Subjective:  Sore/bloated.  Fentanyl helps No nausea but staying strict NPO so ERCP doesn't get cancelled again [place] Dry mouth Walked in hallways x4 Husband at bedside   Objective:  Vital signs:  Vitals:   03/25/16 1428 03/25/16 1807 03/25/16 2129 03/26/16 0513  BP: 115/69 (!) 109/57 99/63 116/70  Pulse: (!) 59 (!) 58 (!) 55 (!) 55  Resp:  '18 18 18  ' Temp: 98.6 F (37 C) 98.5 F (36.9 C) 98.6 F (37 C) 98.5 F (36.9 C)  TempSrc: Oral Oral Oral Oral  SpO2: 100% 100% 100% 99%  Weight:      Height:        Last BM Date: 03/23/16  Intake/Output   Yesterday:  11/08 0701 - 11/09 0700 In: 855.8 [P.O.:240; I.V.:565.8; IV Piggyback:50] Out: 1941 [Urine:1650] This shift:  Total I/O In: 805.8 [P.O.:240; I.V.:565.8] Out: 800 [Urine:800]  Bowel function:  Flatus: YES  BM:  YES  Drain: (No drain)   Physical Exam:  General: Pt awake/alert/oriented x4 in No acute distress Eyes: PERRL, normal EOM.  Sclera clear.  No icterus Neuro: CN II-XII intact w/o focal sensory/motor deficits. Lymph: No head/neck/groin lymphadenopathy Psych:  No delerium/psychosis/paranoia HENT: Normocephalic, Mucus membranes moist.  No thrush Neck: Supple, No tracheal deviation Chest: No chest wall pain w good excursion CV:  Pulses intact.  Regular rhythm MS: Normal AROM mjr joints.  No obvious deformity Abdomen: Soft.  Nondistended.  Tender at incision only.  Dressing c/d/i.  No evidence of peritonitis.  No incarcerated hernias. Ext:  SCDs BLE.  No mjr edema.  No cyanosis Skin: No petechiae / purpura.  Less Jaundiced  Results:   Labs: Results for orders placed or performed during the hospital  encounter of 03/23/16 (from the past 48 hour(s))  MRSA PCR Screening     Status: None   Collection Time: 03/24/16 10:56 AM  Result Value Ref Range   MRSA by PCR NEGATIVE NEGATIVE    Comment:        The GeneXpert MRSA Assay (FDA approved for NASAL specimens only), is one component of a comprehensive MRSA colonization surveillance program. It is not intended to diagnose MRSA infection nor to guide or monitor treatment for MRSA infections.   I-Stat beta hCG blood, ED     Status: None   Collection Time: 03/24/16  2:59 PM  Result Value Ref Range   I-stat hCG, quantitative <5.0 <5 mIU/mL   Comment 3            Comment:   GEST. AGE      CONC.  (mIU/mL)   <=1 WEEK        5 - 50     2 WEEKS       50 - 500     3 WEEKS       100 - 10,000     4 WEEKS     1,000 - 30,000        FEMALE AND NON-PREGNANT FEMALE:     LESS THAN 5 mIU/mL   Hepatic function panel     Status: Abnormal   Collection Time: 03/25/16  6:42 AM  Result Value Ref Range   Total Protein 5.6 (L) 6.5 - 8.1 g/dL   Albumin 3.3 (L) 3.5 - 5.0 g/dL   AST 171 (H) 15 - 41 U/L   ALT 277 (H) 14 - 54 U/L   Alkaline Phosphatase 124 38 - 126 U/L   Total Bilirubin 3.2 (H) 0.3 - 1.2 mg/dL   Bilirubin, Direct 1.7 (H) 0.1 - 0.5 mg/dL   Indirect Bilirubin 1.5 (H) 0.3 - 0.9 mg/dL  Lipase, blood     Status: None   Collection Time: 03/25/16  6:42 AM  Result Value Ref Range   Lipase 21 11 - 51 U/L  Potassium     Status: None   Collection Time: 03/25/16  6:42 AM  Result Value Ref Range   Potassium 4.3 3.5 - 5.1 mmol/L  Creatinine, serum     Status: None   Collection Time: 03/25/16  6:42 AM  Result Value Ref Range   Creatinine, Ser 0.89 0.44 - 1.00 mg/dL   GFR calc non Af Amer >60 >60 mL/min   GFR calc Af Amer >60 >60 mL/min    Comment: (NOTE) The eGFR has been calculated using the CKD EPI equation. This calculation has not been validated in all clinical situations. eGFR's persistently <60 mL/min signify possible Chronic  Kidney Disease.   Comprehensive metabolic panel     Status: Abnormal   Collection Time: 03/26/16  3:00 AM  Result Value Ref Range   Sodium 138 135 - 145 mmol/L   Potassium 3.8 3.5 - 5.1 mmol/L   Chloride 107 101 - 111 mmol/L   CO2 25 22 - 32 mmol/L   Glucose, Bld 137 (H) 65 - 99 mg/dL   BUN <5 (L) 6 - 20 mg/dL   Creatinine, Ser 0.70 0.44 - 1.00 mg/dL   Calcium 8.2 (L) 8.9 - 10.3 mg/dL   Total Protein 5.4 (L) 6.5 -  8.1 g/dL   Albumin 3.2 (L) 3.5 - 5.0 g/dL   AST 288 (H) 15 - 41 U/L   ALT 328 (H) 14 - 54 U/L   Alkaline Phosphatase 128 (H) 38 - 126 U/L   Total Bilirubin 3.3 (H) 0.3 - 1.2 mg/dL   GFR calc non Af Amer >60 >60 mL/min   GFR calc Af Amer >60 >60 mL/min    Comment: (NOTE) The eGFR has been calculated using the CKD EPI equation. This calculation has not been validated in all clinical situations. eGFR's persistently <60 mL/min signify possible Chronic Kidney Disease.    Anion gap 6 5 - 15  CBC     Status: Abnormal   Collection Time: 03/26/16  3:00 AM  Result Value Ref Range   WBC 5.6 4.0 - 10.5 K/uL   RBC 3.49 (L) 3.87 - 5.11 MIL/uL   Hemoglobin 10.9 (L) 12.0 - 15.0 g/dL   HCT 32.3 (L) 36.0 - 46.0 %   MCV 92.6 78.0 - 100.0 fL   MCH 31.2 26.0 - 34.0 pg   MCHC 33.7 30.0 - 36.0 g/dL   RDW 13.0 11.5 - 15.5 %   Platelets 193 150 - 400 K/uL    Imaging / Studies: Dg Cholangiogram Operative  Result Date: 03/25/2016 CLINICAL DATA:  Laparoscopic cholecystectomy.  Cholelithiasis. EXAM: INTRAOPERATIVE CHOLANGIOGRAM TECHNIQUE: Cholangiographic images from the C-arm fluoroscopic device were submitted for interpretation post-operatively. Please see the procedural report for the amount of contrast and the fluoroscopy time utilized. COMPARISON:  CT 03/23/2016 FINDINGS: Contrast opacification of the intrahepatic and extrahepatic biliary system. There is no drainage into the duodenum. Findings compatible with an obstructing lesion in the distal common bile duct. IMPRESSION:  Obstruction in the distal common bile duct. Based on the prior CT, this is most compatible with an obstructing biliary stone. Electronically Signed   By: Markus Daft M.D.   On: 03/25/2016 07:19    Medications / Allergies: per chart  Antibiotics: Anti-infectives    Start     Dose/Rate Route Frequency Ordered Stop   03/25/16 1400  ciprofloxacin (CIPRO) IVPB 400 mg  Status:  Discontinued     400 mg 200 mL/hr over 60 Minutes Intravenous  Once 03/25/16 1326 03/25/16 1422        Note: Portions of this report may have been transcribed using voice recognition software. Every effort was made to ensure accuracy; however, inadvertent computerized transcription errors may be present.   Any transcriptional errors that result from this process are unintentional.     [name], M.D., F.A.C.S. Gastrointestinal and Minimally Invasive Surgery [facility] [address] [place], [place] [address] [phone] Main / Paging   03/26/2016

## 2016-03-26 NOTE — Anesthesia Postprocedure Evaluation (Signed)
Anesthesia Post Note  Patient: Erin Weaver  Procedure(s) Performed: Procedure(s) (LRB): ENDOSCOPIC RETROGRADE CHOLANGIOPANCREATOGRAPHY (ERCP) (N/A)  Patient location during evaluation: PACU Anesthesia Type: General Level of consciousness: awake and alert Pain management: pain level controlled Vital Signs Assessment: post-procedure vital signs reviewed and stable Respiratory status: spontaneous breathing, nonlabored ventilation and respiratory function stable Cardiovascular status: blood pressure returned to baseline and stable Postop Assessment: no signs of nausea or vomiting Anesthetic complications: no    Last Vitals:  Vitals:   03/26/16 1130 03/26/16 1140  BP: (!) 142/92 138/77  Pulse: (!) 58 (!) 58  Resp: 19 (!) 29  Temp:      Last Pain:  Vitals:   03/26/16 1140  TempSrc:   PainSc: 5                  Ivelise Castillo,W. EDMOND

## 2016-03-26 NOTE — Anesthesia Procedure Notes (Signed)
Performed by: Doreatha Offer S     

## 2016-03-26 NOTE — Progress Notes (Signed)
[facility] PROGRESS NOTE  Erin Weaver [id number] DOB: 1965-10-13 DOA: 03/23/2016   PCP: Vikki Ports, MD  Interim summary and HPI 39 with admitted secondary to abd pain and nausea worse with food intake and also with dark urine and light stools. Found to have elevated bilirubin and LFT's. CT scan demonstrating cholelithiasis and choledocholithiasis. Patient admitted for further evaluation and treatment. GI and CCS on board.  Assessment/Plan: Cholelithiasis and choledocholithiasis, transaminitis  - still with significant pain, requiring fentanyl IV Q 2-3 hours - pt has been NPO  - plan for ERCP today - OK to use antiemetics as needed  - CMET In AM  Migraine - continue PRN tylenol for HA  Depression - continue lexapro  Code Status:Full Family Communication: husband at bedside  Disposition Plan: home when surgery and GI team clear    Consultants:  CCS  GI  Procedures:  Cholecystectomy and intraoperative cholangiogram 11/07  ERCP 11/08 or 11/09 planned  Antibiotics:  None   HPI/Subjective: Afebrile, complaining of abd burning sensation.   Objective: Vitals:   03/26/16 0513 03/26/16 0919  BP: 116/70 115/69  Pulse: (!) 55   Resp: 18 19  Temp: 98.5 F (36.9 C) 98.1 F (36.7 C)    Intake/Output Summary (Last 24 hours) at 03/26/16 0921 Last data filed at 03/26/16 0526  Gross per 24 hour  Intake           855.83 ml  Output             1650 ml  Net          -794.17 ml   Filed Weights   03/25/16 0500  Weight: 50.4 kg (111 lb 3.2 oz)    Exam:   General: afebrile, complaining of HA's and nausea. No CP, no SOB. reports no pain abd currently.  Cardiovascular: S1 and S2, no rubs, no gallops  Respiratory: CTA bilaterally   Abdomen: soft, positive BS, no distension, moderate discomfort with light palpation   Musculoskeletal: no edema, no cyanosis  Data Reviewed: Basic Metabolic Panel:  Recent Labs Lab 03/24/16 0625 03/25/16 0642  03/26/16 0300  NA 141  --  138  K 4.2 4.3 3.8  CL 106  --  107  CO2 25  --  25  GLUCOSE 77  --  137*  BUN 6  --  <5*  CREATININE 0.78 0.89 0.70  CALCIUM 9.3  --  8.2*   Liver Function Tests:  Recent Labs Lab 03/23/16 0920 03/24/16 0625 03/25/16 0642 03/26/16 0300  AST 257* 128* 171* 288*  ALT 430* 304* 277* 328*  ALKPHOS 170* 126 124 128*  BILITOT 4.7* 2.0* 3.2* 3.3*  PROT 7.3 6.3* 5.6* 5.4*  ALBUMIN 4.5 3.8 3.3* 3.2*    Recent Labs Lab 03/24/16 0625 03/25/16 0642  LIPASE 25 21   CBC:  Recent Labs Lab 03/23/16 0920 03/24/16 0625 03/26/16 0300  WBC 4.0 4.2 5.6  NEUTROABS 2,240  --   --   HGB 13.1 12.4 10.9*  HCT 37.3 36.9 32.3*  MCV 90.1 92.3 92.6  PLT 246 202 193   Studies: Dg Cholangiogram Operative Result Date: 03/25/2016 Obstruction in the distal common bile duct. Based on the prior CT, this is most compatible with an obstructing biliary stone.   Scheduled Meds: . [MAR Hold] acetaminophen  1,000 mg Oral TID  . [MAR Hold] escitalopram  10 mg Oral Daily  . [MAR Hold] Influenza vac split quadrivalent PF  0.5 mL Intramuscular Tomorrow-1000  . [facility] Hold]  lip balm   Topical BID  . scopolamine  1 patch Transdermal Q72H  . [MAR Hold] sodium chloride flush  3 mL Intravenous Q12H   Continuous Infusions: . sodium chloride 50 mL/hr at 03/25/16 1807  . lactated ringers     Time spent: 25 minutes  MAGICK-Caleb Prigmore, [facility]  [facility]  Pager [phone]. If 7PM-7AM, please contact night-coverage at [email or web address], password [facility] 03/26/2016, 9:21 AM  LOS: 3 days

## 2016-03-26 NOTE — Transfer of Care (Signed)
Immediate Anesthesia Transfer of Care Note  Patient: Erin Weaver  Procedure(s) Performed: Procedure(s): ENDOSCOPIC RETROGRADE CHOLANGIOPANCREATOGRAPHY (ERCP) (N/A)  Patient Location: PACU  Anesthesia Type:General  Level of Consciousness: awake, alert  and oriented  Airway & Oxygen Therapy: Patient Spontanous Breathing and Patient connected to nasal cannula oxygen  Post-op Assessment: Report given to RN, Post -op Vital signs reviewed and stable and Patient moving all extremities X 4  Post vital signs: Reviewed and stable  Last Vitals:  Vitals:   03/26/16 0919 03/26/16 1105  BP: 115/69 (!) 152/85  Pulse:  82  Resp: 19 (!) 7  Temp: 36.7 C 36.9 C    Last Pain:  Vitals:   03/26/16 1105  TempSrc: Oral  PainSc:       Patients Stated Pain Goal: 3 ([id number])  Complications: No apparent anesthesia complications

## 2016-03-26 NOTE — Anesthesia Procedure Notes (Addendum)
Procedure Name: Intubation Date/Time: 03/26/2016 10:04 AM Performed by: Mariea Clonts Pre-anesthesia Checklist: Patient identified, Emergency Drugs available, Suction available, Patient being monitored and Timeout performed Patient Re-evaluated:Patient Re-evaluated prior to inductionOxygen Delivery Method: Circle system utilized Preoxygenation: Pre-oxygenation with 100% oxygen Intubation Type: IV induction, Rapid sequence and Cricoid Pressure applied Ventilation: Mask ventilation without difficulty Laryngoscope Size: Mac and 3 Grade View: Grade I Tube type: Oral Tube size: 7.0 mm Number of attempts: 1 Airway Equipment and Method: Stylet and Bite block Placement Confirmation: ETT inserted through vocal cords under direct vision,  positive ETCO2 and breath sounds checked- equal and bilateral Secured at: 22 cm Tube secured with: Tape Dental Injury: Teeth and Oropharynx as per pre-operative assessment

## 2016-03-26 NOTE — Op Note (Signed)
[facility] Patient Name: Erin Weaver Procedure Date : 03/26/2016 MRN: [id number] Attending MD: Missy Sabins , MD Date of Birth: November 12, 1965 CSN: [id number] Age: 50 Admit Type: Inpatient Procedure:                ERCP Indications:              Bile duct stone(s) Providers:                Elyse Jarvis. Amedeo Plenty, MD, Dortha Schwalbe RN, RN, Ralene Bathe, Technician, Virgilio Belling. Beckner, CRNA Referring MD:              Medicines:                General Anesthesia Complications:            No immediate complications. Estimated Blood Loss:     Estimated blood loss: none. Procedure:                Pre-Anesthesia Assessment:                           - Prior to the procedure, a History and Physical                            was performed, and patient medications and                            allergies were reviewed. The patient's tolerance of                            previous anesthesia was also reviewed. The risks                            and benefits of the procedure and the sedation                            options and risks were discussed with the patient.                            All questions were answered, and informed consent                            was obtained. Prior Anticoagulants: The patient has                            taken no previous anticoagulant or antiplatelet                            agents. ASA Grade Assessment: I - A normal, healthy                            patient. After reviewing the risks and benefits,  the patient was deemed in satisfactory condition to                            undergo the procedure.                           After obtaining informed consent, the scope was                            passed under direct vision. Throughout the                            procedure, the patient's blood pressure, pulse, and                            oxygen saturations were monitored  continuously. The                            [id number]) scope was introduced through                            the mouth, and used to inject contrast into and                            used to inject contrast into the bile duct and                            ventral pancreatic duct. The ERCP was accomplished                            without difficulty. The patient tolerated the                            procedure well. Scope In: Scope Out: Findings:      The major papilla was normal. The ventral pancreatic duct was deeply       cannulated with the long-nosed traction sphincterotome and guidewire.       Contrast was injected. A wire passed successfully into the entire main       bile duct. The biliary sphincterotomy was extended to a total of 6 mm in       length with a traction (standard) sphincterotome using ERBE       electrocautery. There was no post-sphincterotomy bleeding. To discover       objects, the biliary tree was swept with a 12 mm balloon starting at the       upper third of the main bile duct. One stone was removed. No stones       remained. Impression:               - The major papilla appeared normal.                           - Choledocholithiasis was found. Complete removal                            was  accomplished by biliary sphincterotomy and                            balloon extraction.                           - A biliary sphincterotomy was performed.                           - The biliary tree was swept. Recommendation:           - Observe patient's clinical course.                           - Check liver enzymes (AST, ALT, alkaline                            phosphatase, bilirubin) in the morning. Procedure Code(s):        --- Professional ---                           [phone], Endoscopic retrograde                            cholangiopancreatography (ERCP); with removal of                            calculi/debris from biliary/pancreatic  duct(s)                           43262, Endoscopic retrograde                            cholangiopancreatography (ERCP); with                            sphincterotomy/papillotomy Diagnosis Code(s):        --- Professional ---                           K80.50, Calculus of bile duct without cholangitis                            or cholecystitis without obstruction CPT copyright 2016 American Medical Association. All rights reserved. The codes documented in this report are preliminary and upon coder review may  be revised to meet current compliance requirements. Missy Sabins, MD 03/26/2016 11:05:00 AM This report has been signed electronically. Number of Addenda: 0

## 2016-03-27 LAB — COMPREHENSIVE METABOLIC PANEL
ALBUMIN: 3.1 g/dL — AB (ref 3.5–5.0)
ALT: 229 U/L — ABNORMAL HIGH (ref 14–54)
AST: 102 U/L — AB (ref 15–41)
Alkaline Phosphatase: 118 U/L (ref 38–126)
Anion gap: 4 — ABNORMAL LOW (ref 5–15)
CHLORIDE: 109 mmol/L (ref 101–111)
CO2: 27 mmol/L (ref 22–32)
Calcium: 8.4 mg/dL — ABNORMAL LOW (ref 8.9–10.3)
Creatinine, Ser: 0.72 mg/dL (ref 0.44–1.00)
GFR calc Af Amer: >60 mL/min (ref >60)
GFR calc non Af Amer: >60 mL/min (ref >60)
GLUCOSE: 91 mg/dL (ref 65–99)
POTASSIUM: 3.5 mmol/L (ref 3.5–5.1)
Sodium: 140 mmol/L (ref 135–145)
Total Bilirubin: 1.4 mg/dL — ABNORMAL HIGH (ref 0.3–1.2)
Total Protein: 5.2 g/dL — ABNORMAL LOW (ref 6.5–8.1)

## 2016-03-27 LAB — CBC
HEMATOCRIT: 31.4 % — AB (ref 36.0–46.0)
Hemoglobin: 10.5 g/dL — ABNORMAL LOW (ref 12.0–15.0)
MCH: 31.2 pg (ref 26.0–34.0)
MCHC: 33.4 g/dL (ref 30.0–36.0)
MCV: 93.2 fL (ref 78.0–100.0)
Platelets: 183 10*3/uL (ref 150–400)
RBC: 3.37 MIL/uL — ABNORMAL LOW (ref 3.87–5.11)
RDW: 13 % (ref 11.5–15.5)
WBC: 4.4 10*3/uL (ref 4.0–10.5)

## 2016-03-27 LAB — LIPASE, BLOOD
LIPASE: 156 U/L — AB (ref 11–51)
Lipase: 408 U/L — ABNORMAL HIGH (ref 11–51)

## 2016-03-27 MED ORDER — TRAMADOL HCL 50 MG PO TABS: 50.0000 mg | ORAL_TABLET | Freq: Four times a day (QID) | ORAL | 0 refills | Status: DC | PRN

## 2016-03-27 MED ORDER — TRAMADOL HCL 50 MG PO TABS
50.0000 mg | ORAL_TABLET | Freq: Four times a day (QID) | ORAL | Status: DC | PRN
  Administered 2016-03-27: 50 mg via ORAL
  Filled 2016-03-27: qty 1

## 2016-03-27 NOTE — Discharge Instructions (Signed)
LAPAROSCOPIC SURGERY: POST OP INSTRUCTIONS  ######################################################################  EAT Gradually transition to a high fiber diet with a fiber supplement over the next few weeks after discharge.  Start with a pureed / full liquid diet (see below)  WALK Walk an hour a day.  Control your pain to do that.    CONTROL PAIN Control pain so that you can walk, sleep, tolerate sneezing/coughing, go up/down stairs.  HAVE A BOWEL MOVEMENT DAILY Keep your bowels regular to avoid problems.  OK to try a laxative to override constipation.  OK to use an antidairrheal to slow down diarrhea.  Call if not better after 2 tries  CALL IF YOU HAVE PROBLEMS/CONCERNS Call if you are still struggling despite following these instructions. Call if you have concerns not answered by these instructions  ######################################################################    1. DIET: Follow a light bland diet the first 24 hours after arrival home, such as soup, liquids, crackers, etc.  Be sure to include lots of fluids daily.  Avoid fast food or heavy meals as your are more likely to get nauseated.  Eat a low fat the next few days after surgery.   2. Take your usually prescribed home medications unless otherwise directed. 3. PAIN CONTROL: a. Pain is best controlled by a usual combination of three different methods TOGETHER: i. Ice/Heat ii. Over the counter pain medication iii. Prescription pain medication b. Most patients will experience some swelling and bruising around the incisions.  Ice packs or heating pads (30-60 minutes up to 6 times a day) will help. Use ice for the first few days to help decrease swelling and bruising, then switch to heat to help relax tight/sore spots and speed recovery.  Some people prefer to use ice alone, heat alone, alternating between ice & heat.  Experiment to what works for you.  Swelling and bruising can take several weeks to resolve.   c. It is  helpful to take an over-the-counter pain medication regularly for the first few weeks.  Choose one of the following that works best for you: i. Naproxen (Aleve, etc)  Two 251m tabs twice a day ii. Ibuprofen (Advil, etc) Three 2053mtabs four times a day (every meal & bedtime) iii. Acetaminophen (Tylenol, etc) 500-65044mour times a day (every meal & bedtime) d. A  prescription for pain medication (such as oxycodone, hydrocodone, etc) should be given to you upon discharge.  Take your pain medication as prescribed.  i. If you are having problems/concerns with the prescription medicine (does not control pain, nausea, vomiting, rash, itching, etc), please call us [phone] see if we need to switch you to a different pain medicine that will work better for you and/or control your side effect better. ii. If you need a refill on your pain medication, please contact your pharmacy.  They will contact our office to request authorization. Prescriptions will not be filled after 5 pm or on week-ends. 4. Avoid getting constipated.  Between the surgery and the pain medications, it is common to experience some constipation.  Increasing fluid intake and taking a fiber supplement (such as Metamucil, Citrucel, FiberCon, MiraLax, etc) 1-2 times a day regularly will usually help prevent this problem from occurring.  A mild laxative (prune juice, Milk of Magnesia, MiraLax, etc) should be taken according to package directions if there are no bowel movements after 48 hours.   5. Watch out for diarrhea.  If you have many loose bowel movements, simplify your diet to bland foods & liquids for  a few days.  Stop any stool softeners and decrease your fiber supplement.  Switching to mild anti-diarrheal medications (Kayopectate, Pepto Bismol) can help.  If this worsens or does not improve, please call us. 6. Wash / shower every day.  You may shower over the dressings as they are waterproof.  Continue to shower over incision(s)  after the dressing is off. 7. Remove your waterproof bandages 5 days after surgery.  You may leave the incision open to air.  You may replace a dressing/Band-Aid to cover the incision for comfort if you wish.  8. ACTIVITIES as tolerated:   a. You may resume regular (light) daily activities beginning the next day--such as daily self-care, walking, climbing stairs--gradually increasing activities as tolerated.  If you can walk 30 minutes without difficulty, it is safe to try more intense activity such as jogging, treadmill, bicycling, low-impact aerobics, swimming, etc. b. Save the most intensive and strenuous activity for last such as sit-ups, heavy lifting, contact sports, etc  Refrain from any heavy lifting or straining until you are off narcotics for pain control.   c. DO NOT PUSH THROUGH PAIN.  Let pain be your guide: If it hurts to do something, don't do it.  Pain is your body warning you to avoid that activity for another week until the pain goes down. d. You may drive when you are no longer taking prescription pain medication, you can comfortably wear a seatbelt, and you can safely maneuver your car and apply brakes. e. Dennis Bast may have sexual intercourse when it is comfortable.  9. FOLLOW UP in our office a. Please call [facility] at [phone] to set up an appointment to see your surgeon in the office for a follow-up appointment approximately 2-3 weeks after your surgery. b. Make sure that you call for this appointment the day you arrive home to insure a convenient appointment time. 10. IF YOU HAVE DISABILITY OR FAMILY LEAVE FORMS, BRING THEM TO THE OFFICE FOR PROCESSING.  DO NOT GIVE THEM TO YOUR DOCTOR.   WHEN TO CALL us [phone]: 1. Poor pain control 2. Reactions / problems with new medications (rash/itching, nausea, etc)  3. Fever over 101.5 F (38.5 C) 4. Inability to urinate 5. Nausea and/or vomiting 6. Worsening swelling or bruising 7. Continued bleeding from incision. 8. Increased  pain, redness, or drainage from the incision   The clinic staff is available to answer your questions during regular business hours (8:30am-5pm).  Please dont hesitate to call and ask to speak to one of our nurses for clinical concerns.   If you have a medical emergency, go to the nearest emergency room or call 911.  A surgeon from [facility] is always on call at the [facility], [place], [place], [place], [place]  [address] ? MAIN: [phone] ? TOLL FREE: [phone] ?  FAX [phone] [email or web address]    Cholecystitis Cholecystitis is inflammation of the gallbladder. It is often called a gallbladder attack. The gallbladder is a pear-shaped organ that lies beneath the liver on the right side of the body. The gallbladder stores bile, which is a fluid that helps the body to digest fats. If bile builds up in your gallbladder, your gallbladder becomes inflamed. This condition may occur suddenly (be acute). Repeat episodes of acute cholecystitis or prolonged episodes may lead to a long-term (chronic) condition. Cholecystitis is serious and it requires treatment.  CAUSES The most common cause of this condition is  gallstones. Gallstones can block the tube (duct) that carries bile out of your gallbladder. This causes bile to build up. Other causes of this condition include:  Damage to the gallbladder due to a decrease in blood flow.  Infections in the bile ducts.  Scars or kinks in the bile ducts.  Tumors in the liver, pancreas, or gallbladder. RISK FACTORS This condition is more likely to develop in:  People who have sickle cell disease.  People who take birth control pills or use estrogen.  People who have alcoholic liver disease.  People who have liver cirrhosis.  People who have their nutrition delivered through a vein (parenteral nutrition).  People who do not eat or drink (do fasting) for a long  period of time.  People who are obese.  People who have rapid weight loss.  People who are pregnant.  People who have increased triglyceride levels.  People who have pancreatitis. SYMPTOMS Symptoms of this condition include:  Abdominal pain, especially in the upper right area of the abdomen.  Abdominal tenderness or bloating.  Nausea.  Vomiting.  Fever.  Chills.  Yellowing of the skin and the whites of the eyes (jaundice). DIAGNOSIS This condition is diagnosed with a medical history and physical exam. You may also have other tests, including:  Imaging tests, such as:  An ultrasound of the gallbladder.  A CT scan of the abdomen.  A gallbladder nuclear scan (HIDA scan). This scan allows your health care provider to see the bile moving from your liver to your gallbladder and to your small intestine.  MRI.  Blood tests, such as:  A complete blood count, because the white blood cell count may be higher than normal.  Liver function tests, because some levels may be higher than normal with certain types of gallstones. TREATMENT Treatment may include:  Fasting for a certain amount of time.  IV fluids.  Medicine to treat pain or vomiting.  Antibiotic medicine.  Surgery to remove your gallbladder (cholecystectomy). This may happen immediately or at a later time. Chical care will depend on your treatment. In general:  Take over-the-counter and prescription medicines only as told by your health care provider.  If you were prescribed an antibiotic medicine, take it as told by your health care provider. Do not stop taking the antibiotic even if you start to feel better.  Follow instructions from your health care provider about what to eat or drink. When you are allowed to eat, avoid eating or drinking anything that triggers your symptoms.  Keep all follow-up visits as told by your health care provider. This is important. SEEK MEDICAL CARE  IF:  Your pain is not controlled with medicine.  You have a fever. SEEK IMMEDIATE MEDICAL CARE IF:  Your pain moves to another part of your abdomen or to your back.  You continue to have symptoms or you develop new symptoms even with treatment.   This information is not intended to replace advice given to you by your health care provider. Make sure you discuss any questions you have with your health care provider.   Document Released: 05/04/2005 Document Revised: 01/23/2015 Document Reviewed: 08/15/2014 Elsevier Interactive Patient Education 2016 [employer]  Endoscopic Retrograde Cholangiopancreatography (ERCP), Care After Refer to this sheet in the next few weeks. These instructions provide you with information on caring for yourself after your procedure. Your health care provider may also give you more specific instructions. Your treatment has been planned according to current medical practices, but  problems sometimes occur. Call your health care provider if you have any problems or questions after your procedure.  WHAT TO EXPECT AFTER THE PROCEDURE  After your procedure, it is typical to feel:   Soreness in your throat.   Sick to your stomach (nauseous).   Bloated.  Dizzy.   Fatigued. HOME CARE INSTRUCTIONS  Have a friend or family member stay with you for the first 24 hours after your procedure.  Start taking your usual medicines and eating normally as soon as you feel well enough to do so or as directed by your health care provider. SEEK MEDICAL CARE IF:  You have abdominal pain.   You develop signs of infection, such as:   Chills.   Feeling unwell.  SEEK IMMEDIATE MEDICAL CARE IF:  You have difficulty swallowing.  You have worsening throat, chest, or abdominal pain.  You vomit.  You have bloody or very black stools.  You have a fever.   This information is not intended to replace advice given to you by your health care provider. Make sure you  discuss any questions you have with your health care provider.   Document Released: 02/22/2013 Document Reviewed: 02/22/2013 Elsevier Interactive Patient Education [employer].

## 2016-03-27 NOTE — Lactation Note (Signed)
Lipase 408 down to 156, improved.  OK to d/c from surgery standpoint  [name], M.D., F.A.C.S. Gastrointestinal and Minimally Invasive Surgery [facility] [address], [place], [place] [address] [phone] Main / Paging

## 2016-03-27 NOTE — Progress Notes (Signed)
[address] [place], [place] [address] Phone: [phone] FAX: [phone]   BRINKLEY PEET [id number] 1966/01/04  CARE TEAM:  PCP: Vikki Ports, MD  Outpatient Care Team: Patient Care Team: [name], MD as PCP - General (Family Medicine) Michael Boston, MD as Consulting Physician (General Surgery) Teena Irani, MD as Consulting Physician (Gastroenterology)  Inpatient Treatment Team: Treatment Team: Attending Provider: Theodis Blaze, MD; Consulting Physician: Michael Boston, MD; Consulting Physician: Wonda Horner, MD; Registered Nurse: Roselind Rily, RN; Rounding Team: Minerva Ends, MD; Registered Nurse: Ellouise Newer, RN; Consulting Physician: Teena Irani, MD; Registered Nurse: Kennith Center, RN; Technician: Lindajo Royal, NT; Registered Nurse: Roetta Sessions, RN; Technician: Dwana Melena, NT; Technician: Nino Glow, NT; Technician: Army Chaco, NT  Problem List:   Principal Problem:   Common bile duct (CBD) obstruction s/p ERCP 03/26/2016 Active Problems:   Elevated LFTs   Acute on chronic cholecystitis s/p lap cholecystectomy 03/24/2016   Choledocholithiasis   1 Day Post-Op  03/24/2016  POST-OPERATIVE DIAGNOSIS:    CHRONIC CHOLECYSTITIS WITH CALCULUS COMMON BILE DUCT STONE WITH DISTAL CBD OBSTRUCTION  PROCEDURE:  Procedure(s): LAPAROSCOPIC CHOLECYSTECTOMY SINGLE SITE WITH INTRAOPERATIVE CHOLANGIOGRAM  SURGEON:  Surgeon(s): Michael Boston, MD  OR FINDINGS: Inflamed gallbladder with some adhesions consistent with chronic cholecystitis.  Cholangiogram with rather classic biliary anatomy.  Dilated biliary system with contrast going down to distal common bile duct.  Probable reverse meniscus sign.  No contrast into the duodenum despite flushing and intravenous glucagon.  Consistent with persistent obstructing choledocholithiasis.    Assessment  Acute on Chronic  cholecystitis s/p lap chole CBD stone s/p ERCP  Plan:  -solid diet -pain control -VTE prophylaxis- SCDs, etc -mobilize as tolerated to help recovery  D/C patient from hospital when patient meets criteria (anticipate later today):  Tolerating oral intake well Ambulating well Adequate pain control without IV medications Urinating  Having flatus Disposition planning in place   [name], M.D., F.A.C.S. Gastrointestinal and Minimally Invasive Surgery [facility] [address] [place], [place] [address] [phone] Main / Paging   03/27/2016  Subjective:  Less bloated - better after ERCP Sensitive at incision Tol soft diet Walked in hallways  Daughter in room   Objective:  Vital signs:  Vitals:   03/26/16 1140 03/26/16 1320 03/26/16 1947 03/27/16 0506  BP: 138/77 117/64 (!) 155/65 113/72  Pulse: (!) 58 (!) 55 (!) 57 61  Resp: (!) '29  18 18  ' Temp:  98.3 F (36.8 C) 99.9 F (37.7 C) 98 F (36.7 C)  TempSrc:  Oral Oral Oral  SpO2: 100% (!) 88% 100% 98%  Weight:      Height:        Last BM Date: 03/23/16  Intake/Output   Yesterday:  11/09 0701 - 11/10 0700 In: 2450 [I.V.:2450] Out: 5 [Blood:5] This shift:  No intake/output data recorded.  Bowel function:  Flatus: YES  BM:  No  Drain: (No drain)   Physical Exam:  General: Pt awake/alert/oriented x4 in No acute distress Eyes: PERRL, normal EOM.  Sclera clear.  No icterus Neuro: CN II-XII intact w/o focal sensory/motor deficits. Lymph: No head/neck/groin lymphadenopathy Psych:  No delerium/psychosis/paranoia HENT: Normocephalic, Mucus membranes moist.  No thrush Neck: Supple, No tracheal deviation Chest: No chest wall pain w good excursion CV:  Pulses intact.  Regular rhythm MS: Normal AROM mjr joints.  No obvious deformity  Abdomen: Soft.  Nondistended.  Tender at incision only.  Dressing removed - very sensitive - better after 2 minutes (ice helps).   No evidence of peritonitis.  No incarcerated hernias. Ext:  SCDs BLE.  No mjr edema.  No cyanosis Skin: No petechiae / purpura.  Less Jaundiced  Results:   Labs: Results for orders placed or performed during the hospital encounter of 03/23/16 (from the past 48 hour(s))  Comprehensive metabolic panel     Status: Abnormal   Collection Time: 03/26/16  3:00 AM  Result Value Ref Range   Sodium 138 135 - 145 mmol/L   Potassium 3.8 3.5 - 5.1 mmol/L   Chloride 107 101 - 111 mmol/L   CO2 25 22 - 32 mmol/L   Glucose, Bld 137 (H) 65 - 99 mg/dL   BUN <5 (L) 6 - 20 mg/dL   Creatinine, Ser 0.70 0.44 - 1.00 mg/dL   Calcium 8.2 (L) 8.9 - 10.3 mg/dL   Total Protein 5.4 (L) 6.5 - 8.1 g/dL   Albumin 3.2 (L) 3.5 - 5.0 g/dL   AST 288 (H) 15 - 41 U/L   ALT 328 (H) 14 - 54 U/L   Alkaline Phosphatase 128 (H) 38 - 126 U/L   Total Bilirubin 3.3 (H) 0.3 - 1.2 mg/dL   GFR calc non Af Amer >60 >60 mL/min   GFR calc Af Amer >60 >60 mL/min    Comment: (NOTE) The eGFR has been calculated using the CKD EPI equation. This calculation has not been validated in all clinical situations. eGFR's persistently <60 mL/min signify possible Chronic Kidney Disease.    Anion gap 6 5 - 15  CBC     Status: Abnormal   Collection Time: 03/26/16  3:00 AM  Result Value Ref Range   WBC 5.6 4.0 - 10.5 K/uL   RBC 3.49 (L) 3.87 - 5.11 MIL/uL   Hemoglobin 10.9 (L) 12.0 - 15.0 g/dL   HCT 32.3 (L) 36.0 - 46.0 %   MCV 92.6 78.0 - 100.0 fL   MCH 31.2 26.0 - 34.0 pg   MCHC 33.7 30.0 - 36.0 g/dL   RDW 13.0 11.5 - 15.5 %   Platelets 193 150 - 400 K/uL  Comprehensive metabolic panel     Status: Abnormal   Collection Time: 03/27/16  3:41 AM  Result Value Ref Range   Sodium 140 135 - 145 mmol/L   Potassium 3.5 3.5 - 5.1 mmol/L   Chloride 109 101 - 111 mmol/L   CO2 27 22 - 32 mmol/L   Glucose, Bld 91 65 - 99 mg/dL   BUN <5 (L) 6 - 20 mg/dL   Creatinine, Ser 0.72 0.44 - 1.00 mg/dL   Calcium 8.4 (L) 8.9 - 10.3 mg/dL   Total  Protein 5.2 (L) 6.5 - 8.1 g/dL   Albumin 3.1 (L) 3.5 - 5.0 g/dL   AST 102 (H) 15 - 41 U/L   ALT 229 (H) 14 - 54 U/L   Alkaline Phosphatase 118 38 - 126 U/L   Total Bilirubin 1.4 (H) 0.3 - 1.2 mg/dL   GFR calc non Af Amer >60 >60 mL/min   GFR calc Af Amer >60 >60 mL/min    Comment: (NOTE) The eGFR has been calculated using the CKD EPI equation. This calculation has not been validated in all clinical situations. eGFR's persistently <60 mL/min signify possible Chronic Kidney Disease.    Anion gap 4 (L) 5 - 15  CBC     Status: Abnormal  Collection Time: 03/27/16  3:41 AM  Result Value Ref Range   WBC 4.4 4.0 - 10.5 K/uL   RBC 3.37 (L) 3.87 - 5.11 MIL/uL   Hemoglobin 10.5 (L) 12.0 - 15.0 g/dL   HCT 31.4 (L) 36.0 - 46.0 %   MCV 93.2 78.0 - 100.0 fL   MCH 31.2 26.0 - 34.0 pg   MCHC 33.4 30.0 - 36.0 g/dL   RDW 13.0 11.5 - 15.5 %   Platelets 183 150 - 400 K/uL    Imaging / Studies: Dg Ercp Biliary & Pancreatic Ducts  Result Date: 03/26/2016 CLINICAL DATA:  Common bile duct stones EXAM: ERCP TECHNIQUE: Multiple spot images obtained with the fluoroscopic device and submitted for interpretation post-procedure. FLUOROSCOPY TIME:  Fluoroscopy Time:  3 minutes and 3 seconds Radiation Exposure Index (if provided by the fluoroscopic device): Number of Acquired Spot Images: Four COMPARISON:  03/24/2016 FINDINGS: Imaging confirms cannulation of the common bile duct and contrast filling the common bile duct. No obvious calculi are identified. IMPRESSION: See above. These images were submitted for radiologic interpretation only. Please see the procedural report for the amount of contrast and the fluoroscopy time utilized. Electronically Signed   By: Marybelle Killings M.D.   On: 03/26/2016 11:12    Medications / Allergies: per chart  Antibiotics: Anti-infectives    Start     Dose/Rate Route Frequency Ordered Stop   03/25/16 1400  ciprofloxacin (CIPRO) IVPB 400 mg  Status:  Discontinued     400 mg 200  mL/hr over 60 Minutes Intravenous  Once 03/25/16 1326 03/25/16 1422        Note: Portions of this report may have been transcribed using voice recognition software. Every effort was made to ensure accuracy; however, inadvertent computerized transcription errors may be present.   Any transcriptional errors that result from this process are unintentional.     [name], M.D., F.A.C.S. Gastrointestinal and Minimally Invasive Surgery [facility] [address] [place], [place] [address] [phone] Main / Paging   03/27/2016

## 2016-03-27 NOTE — Discharge Summary (Signed)
Physician Discharge Summary  Erin Weaver [id number] DOB: [birth year] DOA: 03/23/2016  PCP: Vikki Ports, MD  Admit date: 03/23/2016 Discharge date: 03/27/2016  Recommendations for Outpatient Follow-up:  1. Pt will need to follow up with PCP in 1-2 weeks post discharge 2. Please obtain liver function tests to make sure trending down   Discharge Diagnoses:  Principal Problem:   Common bile duct (CBD) obstruction s/p ERCP 03/26/2016 Active Problems:   Elevated LFTs   Acute on chronic cholecystitis s/p lap cholecystectomy 03/24/2016   Choledocholithiasis  Discharge Condition: Stable  Diet recommendation: Heart healthy diet discussed in details   Interim summary and HPI 50 with admitted secondary to abd pain and nausea worse with food intake and also with dark urine and light stools. Found to have elevated bilirubin and LFT's. CT scan demonstrating cholelithiasis and choledocholithiasis. Patient admitted for further evaluation and treatment. GI and [facility] on board.  Assessment/Plan: Cholelithiasis and choledocholithiasis, transaminitis  - s/p ERCP today, stone removed, pt so far tolerating diet well  - OK to use antiemetics as needed  - lipase is up this AM 408 which was normal before ERCP, I will repeat again this afternoon to make sure trending down   Migraine - continue PRN tylenol for HA  Depression - continue lexapro  Code Status:Full Family Communication: husband at bedside  Disposition Plan: home if lipase trending down    Consultants:  [facility]  GI  Procedures:  Cholecystectomy and intraoperative cholangiogram 11/07  ERCP 11/08 or 11/09 planned  Antibiotics:  None   Procedures/Studies: Dg Cholangiogram Operative  Result Date: 03/25/2016 CLINICAL DATA:  Laparoscopic cholecystectomy.  Cholelithiasis. EXAM: INTRAOPERATIVE CHOLANGIOGRAM TECHNIQUE: Cholangiographic images from the C-arm fluoroscopic device were submitted for interpretation  post-operatively. Please see the procedural report for the amount of contrast and the fluoroscopy time utilized. COMPARISON:  CT 03/23/2016 FINDINGS: Contrast opacification of the intrahepatic and extrahepatic biliary system. There is no drainage into the duodenum. Findings compatible with an obstructing lesion in the distal common bile duct. IMPRESSION: Obstruction in the distal common bile duct. Based on the prior CT, this is most compatible with an obstructing biliary stone. Electronically Signed   By: Markus Daft M.D.   On: 03/25/2016 07:19   Ct Abdomen Pelvis W Contrast  Result Date: 03/23/2016 CLINICAL DATA:  Epigastric pain with right upper quadrant pain 4 days with nausea and vomiting. EXAM: CT ABDOMEN AND PELVIS WITH CONTRAST TECHNIQUE: Multidetector CT imaging of the abdomen and pelvis was performed using the standard protocol following bolus administration of intravenous contrast. CONTRAST:  13mL ISOVUE-300 IOPAMIDOL (ISOVUE-300) INJECTION 61% COMPARISON:  None. FINDINGS: Lower chest: Minimal scarring over the medial inferior right middle lobe. Hepatobiliary: Moderate cholelithiasis present. 4 mm stone projected in the region of the gallbladder neck/cystic duct. Mild prominence of the common bowel duct measuring 7 mm. Liver is within normal. The Pancreas: Pancreas is normal. There is a 2.5 mm stone over the distal common bile duct at the ampulla. Spleen: Within normal. Adrenals/Urinary Tract: Adrenal glands are normal. Kidneys are normal in size without hydronephrosis or nephrolithiasis. Ureters and bladder are within normal. Stomach/Bowel: Stomach and small bowel are within normal. Appendix is normal. Descending colon is decompressed as the colon is otherwise within normal. Vascular/Lymphatic: Within normal. Reproductive: Within normal. Other: Small amount of free fluid in the pelvis likely physiologic. Musculoskeletal: Within normal. IMPRESSION: Moderate cholelithiasis. 4 mm stone over the region of  the gallbladder neck/ cystic duct. Additional small 2.5 mm stone over the distal  common bile duct at the ampulla likely responsible for patient's pain. Mild associated dilatation of the common bile duct measuring 7 mm. Right upper quadrant ultrasound may be helpful for further evaluation to exclude acute cholecystitis. Electronically Signed   By: Marin Olp M.D.   On: 03/23/2016 14:31   Dg Ercp Biliary & Pancreatic Ducts  Result Date: 03/26/2016 CLINICAL DATA:  Common bile duct stones EXAM: ERCP TECHNIQUE: Multiple spot images obtained with the fluoroscopic device and submitted for interpretation post-procedure. FLUOROSCOPY TIME:  Fluoroscopy Time:  3 minutes and 3 seconds Radiation Exposure Index (if provided by the fluoroscopic device): Number of Acquired Spot Images: Four COMPARISON:  03/24/2016 FINDINGS: Imaging confirms cannulation of the common bile duct and contrast filling the common bile duct. No obvious calculi are identified. IMPRESSION: See above. These images were submitted for radiologic interpretation only. Please see the procedural report for the amount of contrast and the fluoroscopy time utilized. Electronically Signed   By: Marybelle Killings M.D.   On: 03/26/2016 11:12     Discharge Exam: Vitals:   03/26/16 1947 03/27/16 0506  BP: (!) 155/65 113/72  Pulse: (!) 57 61  Resp: 18 18  Temp: 99.9 F (37.7 C) 98 F (36.7 C)   Vitals:   03/26/16 1140 03/26/16 1320 03/26/16 1947 03/27/16 0506  BP: 138/77 117/64 (!) 155/65 113/72  Pulse: (!) 58 (!) 55 (!) 57 61  Resp: (!) 29  18 18   Temp:  98.3 F (36.8 C) 99.9 F (37.7 C) 98 F (36.7 C)  TempSrc:  Oral Oral Oral  SpO2: 100% (!) 88% 100% 98%  Weight:      Height:        General: Pt is alert, follows commands appropriately, not in acute distress Cardiovascular: Regular rate and rhythm, S1/S2 +, no murmurs, no rubs, no gallops Respiratory: Clear to auscultation bilaterally, no wheezing, no crackles, no rhonchi Abdominal:  Soft, slightly tender in upper abd quadrants L < R, non distended, bowel sounds +, no guarding Extremities: no edema, no cyanosis, pulses palpable bilaterally DP and PT Neuro: Grossly nonfocal  Discharge Instructions  Discharge Instructions    Call MD for:    Complete by:  As directed    Temperature > 101.666F   Call MD for:    Complete by:  As directed    Temperature > 101.666F   Call MD for:  extreme fatigue    Complete by:  As directed    Call MD for:  extreme fatigue    Complete by:  As directed    Call MD for:  hives    Complete by:  As directed    Call MD for:  hives    Complete by:  As directed    Call MD for:  persistant nausea and vomiting    Complete by:  As directed    Call MD for:  persistant nausea and vomiting    Complete by:  As directed    Call MD for:  redness, tenderness, or signs of infection (pain, swelling, redness, odor or green/yellow discharge around incision site)    Complete by:  As directed    Call MD for:  redness, tenderness, or signs of infection (pain, swelling, redness, odor or green/yellow discharge around incision site)    Complete by:  As directed    Call MD for:  severe uncontrolled pain    Complete by:  As directed    Call MD for:  severe uncontrolled pain  Complete by:  As directed    Diet - low sodium heart healthy    Complete by:  As directed    Start with bland, low residue diet for a few days, then advance to a heart healthy (low fat, high fiber) diet.  If you feel nauseated or constipated, simplify to a liquid only diet for 48 hours until you are feeling better (no more nausea, farting/passing gas, having a bowel movement, etc...).  If you cannot tolerate even drinking liquids, or feeling worse, let your surgeon know or go to the Emergency Department for help.   Diet - low sodium heart healthy    Complete by:  As directed    Start with bland, low residue diet for a few days, then advance to a heart healthy (low fat, high fiber) diet.  If  you feel nauseated or constipated, simplify to a liquid only diet for 48 hours until you are feeling better (no more nausea, farting/passing gas, having a bowel movement, etc...).  If you cannot tolerate even drinking liquids, or feeling worse, let your surgeon know or go to the Emergency Department for help.   Discharge instructions    Complete by:  As directed    Please see discharge instruction sheets.   Also refer to any handouts/printouts that may have been given from the [facility] surgery office (if you visited [place] there before surgery) Please call our office if you have any questions or concerns [phone]   Discharge instructions    Complete by:  As directed    Please see discharge instruction sheets.   Also refer to any handouts/printouts that may have been given from the [facility] surgery office (if you visited [place] there before surgery) Please call our office if you have any questions or concerns [phone]   Discharge wound care:    Complete by:  As directed    If you have closed incisions: Shower and bathe over these incisions with soap and water every day.  It is OK to wash over the dressings: they are waterproof. Remove all surgical dressings on postoperative day #3.  You do not need to replace dressings over the closed incisions unless you feel more comfortable with a Band-Aid covering it.   If you have an open wound: That requires packing, so please see wound care instructions.   In general, remove all dressings, wash wound with soap and water and then replace with saline moistened gauze.  Do the dressing change at least every day.    Please call our office [phone] if you have further questions.   Discharge wound care:    Complete by:  As directed    If you have closed incisions: Shower and bathe over these incisions with soap and water every day.  It is OK to wash over the dressings: they are waterproof. Remove all surgical dressings on postoperative day #3.  You do not need  to replace dressings over the closed incisions unless you feel more comfortable with a Band-Aid covering it.   If you have an open wound: That requires packing, so please see wound care instructions.   In general, remove all dressings, wash wound with soap and water and then replace with saline moistened gauze.  Do the dressing change at least every day.    Please call our office [phone] if you have further questions.   Driving Restrictions    Complete by:  As directed    No driving until off narcotics and can  safely swerve away without pain during an emergency   Driving Restrictions    Complete by:  As directed    No driving until off narcotics and can safely swerve away without pain during an emergency   Increase activity slowly    Complete by:  As directed    Increase activity slowly    Complete by:  As directed    Lifting restrictions    Complete by:  As directed    Avoid heavy lifting initially, <20 pounds at first.   Do not push through pain.   You have no specific weight limit: If it hurts to do, DON'T DO IT.    If you feel no pain, you are not injuring anything.  Pain will protect you from injury.   Coughing and sneezing are far more stressful to your incision than any lifting.   Avoid resuming heavy lifting (>50 pounds) or other intense activity until off all narcotic pain medications.   When want to exercise more, give yourself 2 weeks to gradually get back to full intense exercise/activity.   Lifting restrictions    Complete by:  As directed    Avoid heavy lifting initially, <20 pounds at first.   Do not push through pain.   You have no specific weight limit: If it hurts to do, DON'T DO IT.    If you feel no pain, you are not injuring anything.  Pain will protect you from injury.   Coughing and sneezing are far more stressful to your incision than any lifting.   Avoid resuming heavy lifting (>50 pounds) or other intense activity until off all narcotic pain  medications.   When want to exercise more, give yourself 2 weeks to gradually get back to full intense exercise/activity.   May shower / Bathe    Complete by:  As directed    [place].  It is fine for dressings or wounds to be washed/rinsed.  Use gentle soap & water.  This will help the incisions and/or wounds get clean & minimize infection.   May shower / Bathe    Complete by:  As directed    [place].  It is fine for dressings or wounds to be washed/rinsed.  Use gentle soap & water.  This will help the incisions and/or wounds get clean & minimize infection.   May walk up steps    Complete by:  As directed    May walk up steps    Complete by:  As directed    Sexual Activity Restrictions    Complete by:  As directed    Sexual activity as tolerated.  Do not push through pain.  Pain will protect you from injury.   Sexual Activity Restrictions    Complete by:  As directed    Sexual activity as tolerated.  Do not push through pain.  Pain will protect you from injury.   Walk with assistance    Complete by:  As directed    Walk over an hour a day.  May use a walker/cane/companion to help with balance and stamina.   Walk with assistance    Complete by:  As directed    Walk over an hour a day.  May use a walker/cane/companion to help with balance and stamina.       Medication List    TAKE these medications   acetaminophen 325 MG tablet Commonly known as:  TYLENOL Take 650 mg by mouth every 6 (six) hours as  needed.   diphenhydramine-acetaminophen 25-500 MG Tabs tablet Commonly known as:  TYLENOL PM Take 1 tablet by mouth at bedtime as needed.   escitalopram 10 MG tablet Commonly known as:  LEXAPRO   famotidine-calcium carbonate-magnesium hydroxide 10-800-165 MG chewable tablet Commonly known as:  PEPCID COMPLETE Chew 1 tablet by mouth daily as needed.   ibuprofen 200 MG tablet Commonly known as:  ADVIL,MOTRIN Take 400 mg by mouth every 6 (six) hours as needed.    traMADol 50 MG tablet Commonly known as:  ULTRAM Take 1-2 tablets (50-100 mg total) by mouth every 6 (six) hours as needed for moderate pain or severe pain.      Follow-up Information    GROSS,STEVEN C., MD. Schedule an appointment as soon as possible for a visit in 3 week(s).   Specialty:  General Surgery Why:  To follow up after your operation, To follow up after your hospital stay Contact information: [address] [place] [address] [phone]        [name] A, MD Follow up.   Specialty:  Family Medicine Contact information: [place] [address] [phone]            The results of significant diagnostics from this hospitalization (including imaging, microbiology, ancillary and laboratory) are listed below for reference.     Microbiology: Recent Results (from the past 240 hour(s))  MRSA PCR Screening     Status: None   Collection Time: 03/24/16 10:56 AM  Result Value Ref Range Status   MRSA by PCR NEGATIVE NEGATIVE Final    Comment:        The GeneXpert MRSA Assay (FDA approved for NASAL specimens only), is one component of a comprehensive MRSA colonization surveillance program. It is not intended to diagnose MRSA infection nor to guide or monitor treatment for MRSA infections.      Labs: Basic Metabolic Panel:  Recent Labs Lab 03/24/16 0625 03/25/16 0642 03/26/16 0300 03/27/16 0341  NA 141  --  138 140  K 4.2 4.3 3.8 3.5  CL 106  --  107 109  CO2 25  --  25 27  GLUCOSE 77  --  137* 91  BUN 6  --  <5* <5*  CREATININE 0.78 0.89 0.70 0.72  CALCIUM 9.3  --  8.2* 8.4*   Liver Function Tests:  Recent Labs Lab 03/23/16 0920 03/24/16 0625 03/25/16 0642 03/26/16 0300 03/27/16 0341  AST 257* 128* 171* 288* 102*  ALT 430* 304* 277* 328* 229*  ALKPHOS 170* 126 124 128* 118  BILITOT 4.7* 2.0* 3.2* 3.3* 1.4*  PROT 7.3 6.3* 5.6* 5.4* 5.2*  ALBUMIN 4.5 3.8 3.3* 3.2* 3.1*    Recent Labs Lab  03/24/16 0625 03/25/16 0642 03/27/16 0341  LIPASE 25 21 408*   CBC:  Recent Labs Lab 03/23/16 0920 03/24/16 0625 03/26/16 0300 03/27/16 0341  WBC 4.0 4.2 5.6 4.4  NEUTROABS 2,240  --   --   --   HGB 13.1 12.4 10.9* 10.5*  HCT 37.3 36.9 32.3* 31.4*  MCV 90.1 92.3 92.6 93.2  PLT 246 202 193 183    SIGNED: Time coordinating discharge: 30 minutes  MAGICK-Laquanda Bick, MD  [facility] 03/27/2016, 10:33 AM Pager [phone]  If 7PM-7AM, please contact night-coverage [email or web address] Password [email or web address]

## 2016-03-27 NOTE — Progress Notes (Signed)
D/C papers gone over with pt. Per MD prescription sent over to [place]. IV taken out. NO questions/complaints.

## 2016-03-30 ENCOUNTER — Telehealth: Payer: Self-pay | Admitting: Family Medicine

## 2016-03-30 NOTE — Telephone Encounter (Signed)
Left message for pt to call. Pt needs a hospital follow up appt.

## 2016-03-31 NOTE — Progress Notes (Signed)
Chief Complaint  Patient presents with  . Hospitalization Follow-up    follow up.   Patient presents for hospital f/u. She was hospitalized 11/6-11/10/17 with CBD obstruction, choledocholithiasis and acute on chronic cholecystitis.  During hospitalization she had lap cholecystectomy (11/7) followed by ERCP on 11/9.  This wasn't done initially, as it was felt the stone may have passed through the CBD when her bilirubin improved significantly on second hospital day.  Intraoperative cholangiogram, however, showed obstructing stone. She tolerated the procedures well without complication.  She has some incisional discomfort. She is reporting some ongoing dizziness. feeling weak and shaky, get out of breath with talking.  She has walked a mile daily (maybe more) without any shortness of breath. She is tired and needs to rest after her walk, but not short of breath.  No further epigastric pain or vomiting. She is eating a regular diet--trying to be sure to eat smaller meals more frequently. Denies nausea, abdominal pain, vomiting.  Stools are regular. Denies significant abdominal pain, slight incisional discomfort. Uses occasional ibuprofen.  Tramadol caused nausea just like other pain meds.  She presents today for follow-up labs. She is going OOT for Thanksgiving, leaving this weekend.  She had elevated LFT's and lipase (s/p ERCP), that had been trending downward.  PMH, PSH, SH reviewed  Outpatient Encounter Prescriptions as of 04/02/2016  Medication Sig Note  . acetaminophen (TYLENOL) 325 MG tablet Take 650 mg by mouth every 6 (six) hours as needed.   . diphenhydramine-acetaminophen (TYLENOL PM) 25-500 MG TABS tablet Take 1 tablet by mouth at bedtime as needed. 04/02/2016: Uses prn  . escitalopram (LEXAPRO) 10 MG tablet  03/23/2016: Has not taken med in past 2 weeks, still has takes occasionally  . ibuprofen (ADVIL,MOTRIN) 200 MG tablet Take 400 mg by mouth every 6 (six) hours as needed. 04/02/2016:  Using prn (none today)  . [DISCONTINUED] famotidine-calcium carbonate-magnesium hydroxide (PEPCID COMPLETE) 10-800-165 MG chewable tablet Chew 1 tablet by mouth daily as needed.   . [DISCONTINUED] traMADol (ULTRAM) 50 MG tablet Take 1-2 tablets (50-100 mg total) by mouth every 6 (six) hours as needed for moderate pain or severe pain.    No facility-administered encounter medications on file as of 04/02/2016.    Allergies  Allergen Reactions  . Erythromycin Rash  . Sulfa Antibiotics Rash   ROS: no fever, chills, chest pain, nausea, vomiting, abdominal pain (just incisional), urinary complaints, bleeding, bruising, rash.  PHYSICAL EXAM:  BP 112/68 (BP Location: Left Arm, Patient Position: Sitting, Cuff Size: Normal)   Pulse 68   Ht 5\' 2"  (1.575 m)   Wt 111 lb (50.3 kg)   LMP 03/01/2016 Comment: neg preg test 03/24/16  BMI 20.30 kg/m   Well appearing, pleasant female in no distress HEENT: conjunctiva and sclera are clear, anicteric Neck: no lymphadenopathy or mass Heart: regular rate and rhythm without murmur Lungs: clear bilaterally Abdomen: Umbilical area--healing incision without drainage, erythema, warmth.  +yellow ecchymosis. Mildly tender Normal bowel sounds. No epigastric tenderness, organomegaly or Murphy sign. Skin: normal turgor, no jaundice, rashes, lesions  Lab Results  Component Value Date   WBC 4.4 03/27/2016   HGB 10.5 (L) 03/27/2016   HCT 31.4 (L) 03/27/2016   MCV 93.2 03/27/2016   PLT 183 03/27/2016   Lab Results  Component Value Date   LIPASE 156 (H) 03/27/2016     Chemistry      Component Value Date/Time   NA 140 03/27/2016 0341   K 3.5 03/27/2016 0341   CL  109 03/27/2016 0341   CO2 27 03/27/2016 0341   BUN <5 (L) 03/27/2016 0341   CREATININE 0.72 03/27/2016 0341      Component Value Date/Time   CALCIUM 8.4 (L) 03/27/2016 0341   ALKPHOS 118 03/27/2016 0341   AST 102 (H) 03/27/2016 0341   ALT 229 (H) 03/27/2016 0341   BILITOT 1.4 (H) 03/27/2016  0341       ASSESSMENT/PLAN:  Elevated LFTs - Plan: Hepatic function panel  Acute on chronic cholecystitis s/p lap cholecystectomy 03/24/2016 - Plan: Hepatic function panel  Common bile duct (CBD) obstruction s/p ERCP 03/26/2016 - Plan: Hepatic function panel, Lipase  Need for prophylactic vaccination and inoculation against influenza - Plan: Flu Vaccine QUAD 36+ mos PF IM (Fluarix & Fluzone Quad PF)  Shortness of breath - suspect possibly related to procedure/anesthesia (intubation), vs anemia; recheck CBC - Plan: CBC with Differential/Platelet    Hepatic panel, lipase, CBC

## 2016-04-02 ENCOUNTER — Ambulatory Visit (INDEPENDENT_AMBULATORY_CARE_PROVIDER_SITE_OTHER): Payer: 59 | Admitting: Family Medicine

## 2016-04-02 ENCOUNTER — Encounter: Payer: Self-pay | Admitting: Family Medicine

## 2016-04-02 ENCOUNTER — Inpatient Hospital Stay: Payer: 59 | Admitting: Family Medicine

## 2016-04-02 VITALS — BP 112/68 | HR 68 | Ht 62.0 in | Wt 111.0 lb

## 2016-04-02 DIAGNOSIS — K831 Obstruction of bile duct: Secondary | ICD-10-CM

## 2016-04-02 DIAGNOSIS — K812 Acute cholecystitis with chronic cholecystitis: Secondary | ICD-10-CM | POA: Diagnosis not present

## 2016-04-02 DIAGNOSIS — Z23 Encounter for immunization: Secondary | ICD-10-CM | POA: Diagnosis not present

## 2016-04-02 DIAGNOSIS — R0602 Shortness of breath: Secondary | ICD-10-CM | POA: Diagnosis not present

## 2016-04-02 DIAGNOSIS — R7989 Other specified abnormal findings of blood chemistry: Secondary | ICD-10-CM | POA: Diagnosis not present

## 2016-04-02 DIAGNOSIS — R945 Abnormal results of liver function studies: Principal | ICD-10-CM

## 2016-04-02 LAB — HEPATIC FUNCTION PANEL
ALT: 67 U/L — AB (ref 6–29)
AST: 28 U/L (ref 10–35)
Albumin: 4.4 g/dL (ref 3.6–5.1)
Alkaline Phosphatase: 100 U/L (ref 33–115)
BILIRUBIN DIRECT: 0.3 mg/dL — AB (ref <=0.2)
BILIRUBIN INDIRECT: 0.4 mg/dL (ref 0.2–1.2)
Total Bilirubin: 0.7 mg/dL (ref 0.2–1.2)
Total Protein: 7.2 g/dL (ref 6.1–8.1)

## 2016-04-02 LAB — CBC WITH DIFFERENTIAL/PLATELET
BASOS PCT: 1 %
Basophils Absolute: 64 cells/uL (ref 0–200)
Eosinophils Absolute: 128 cells/uL (ref 15–500)
Eosinophils Relative: 2 %
HEMATOCRIT: 39.8 % (ref 35.0–45.0)
HEMOGLOBIN: 13.3 g/dL (ref 11.7–15.5)
LYMPHS ABS: 1728 {cells}/uL (ref 850–3900)
Lymphocytes Relative: 27 %
MCH: 31.5 pg (ref 27.0–33.0)
MCHC: 33.4 g/dL (ref 32.0–36.0)
MCV: 94.3 fL (ref 80.0–100.0)
MONO ABS: 384 {cells}/uL (ref 200–950)
MPV: 10.5 fL (ref 7.5–12.5)
Monocytes Relative: 6 %
Neutro Abs: 4096 cells/uL (ref 1500–7800)
Neutrophils Relative %: 64 %
Platelets: 384 10*3/uL (ref 140–400)
RBC: 4.22 MIL/uL (ref 3.80–5.10)
RDW: 13.8 % (ref 11.0–15.0)
WBC: 6.4 10*3/uL (ref 4.0–10.5)

## 2016-04-02 NOTE — Patient Instructions (Signed)
Follow Dr. Clyda Greener recommendations (please!). Continue to drink plenty of fluids, and not skip meals. I will let you know your test results tomorrow. Once you get clearance to resume more vigorous activities, take it slow, and remember to allow recovery days.

## 2016-04-03 LAB — LIPASE: Lipase: 78 U/L — ABNORMAL HIGH (ref 7–60)

## 2016-04-07 ENCOUNTER — Encounter: Payer: Self-pay | Admitting: Family Medicine

## 2016-04-30 ENCOUNTER — Encounter: Payer: Self-pay | Admitting: Family Medicine

## 2016-05-26 ENCOUNTER — Other Ambulatory Visit: Payer: Self-pay | Admitting: Family Medicine

## 2016-05-26 ENCOUNTER — Telehealth: Payer: Self-pay | Admitting: Family Medicine

## 2016-05-26 DIAGNOSIS — M5416 Radiculopathy, lumbar region: Secondary | ICD-10-CM

## 2016-05-26 MED ORDER — MELOXICAM 15 MG PO TABS: 15.0000 mg | ORAL_TABLET | Freq: Every day | ORAL | 0 refills | Status: DC

## 2016-05-26 NOTE — Telephone Encounter (Signed)
Patient has been dealing with low back pain, seeing chiropractor (getting stretching, ART, e-stim, and home exercises).  She recently has started having severe pain into the right lateral thigh.  She is awaiting eval by sports med next week.  She reports that ibuprofen helps some (but taking sporadically).  She has played tennis just a couple of times, with no worsening of pain. She has been doing the elliptical and swimming.  Tried treadmill once for just a bit--stopped when it made things worse.    Pain seems to be in L4 distribution, but didn't improve from conservative measures from chiro/PT.  Hasn't really had good NSAID trial.  Discussed OTC Aleve in rx doses, as well as ibuprofen.  Prefers once daily rx.  Advised to take meloxicam once daily with food, no other NSAIDs.  Briefly discussed prednisone/steroid course, but will try NSAIDs first.

## 2016-05-26 NOTE — Telephone Encounter (Signed)
Pharmacy is stating patient is requesting 90 day supply

## 2016-05-26 NOTE — Telephone Encounter (Signed)
Pt did not request #90, but apparently they have it down in her profile at the pharmacy that she prefers 90d.  She picked up #15 from the pharmacy

## 2016-05-26 NOTE — Telephone Encounter (Signed)
I'm checking with pt to see if she really requested this--it is NOT a long-term med, just supposed to be a two-week course.  Might need some extra for prn use, but thought we would see how she tolerated it and if it was effective since it is a new med for her.  I'm waiting to here back from her

## 2016-06-01 ENCOUNTER — Encounter: Payer: Self-pay | Admitting: Sports Medicine

## 2016-06-01 ENCOUNTER — Ambulatory Visit (INDEPENDENT_AMBULATORY_CARE_PROVIDER_SITE_OTHER): Payer: 59 | Admitting: Sports Medicine

## 2016-06-01 ENCOUNTER — Ambulatory Visit
Admission: RE | Admit: 2016-06-01 | Discharge: 2016-06-01 | Disposition: A | Discharge status: Home / Self Care | Payer: 59 | Source: Ambulatory Visit | Attending: Sports Medicine | Admitting: Sports Medicine

## 2016-06-01 VITALS — BP 112/70 | Ht 62.0 in | Wt 115.0 lb

## 2016-06-01 DIAGNOSIS — G8929 Other chronic pain: Secondary | ICD-10-CM

## 2016-06-01 DIAGNOSIS — M5441 Lumbago with sciatica, right side: Secondary | ICD-10-CM

## 2016-06-01 DIAGNOSIS — M5431 Sciatica, right side: Secondary | ICD-10-CM | POA: Diagnosis not present

## 2016-06-01 DIAGNOSIS — M545 Low back pain: Secondary | ICD-10-CM | POA: Diagnosis not present

## 2016-06-01 NOTE — Assessment & Plan Note (Addendum)
History and physical exam consistent with back pain with sciatica. There is some concern for bulging disk but her back pain with backward extension concerning for alternate or additional back pathology - will obtain Xray and MRI of her back as her pain has been significant and limiting of her usual activities - continue NSAIDs as needed for pain - return after imaging complete

## 2016-06-01 NOTE — Progress Notes (Signed)
  Subjective:    Patient ID: Erin Weaver, female    DOB: 03/30/1966, 51 y.o.   MRN: [id number]   CC: New pateint, back pain with right leg pain  HPI: 51 y/o F presenting for back and right kleg pain  Back pain - noted after returning to her usual running routine after having urgent cholecystectomy on 03/23/2016 - she runs half marathons, triathalons and plays tennis -  After returning to running she felt a "twinge" in her back that worsened when playing tennis the next day - she then started having the sensation of a right " dead leg" in which her right outer thigh was has severe pain and numbness with sitting - she reduced her running and started to more aggressively stretch her back and LEs - she started mobic which helped her pain but did not resolve it - her pain was worse with lumbar flexion but also has pain with extension - she denies numbnes or tingling of her legs, denies weakness, denies knee pain  Pertinent Past medical history- urgent cholecystectomy  Review of Systems  Per HPI   Objective:  BP 112/70   Ht 5\' 2"  (1.575 m)   Wt 115 lb (52.2 kg)   BMI 21.03 kg/m  Vitals and nursing note reviewed  General: NAD MSK: 5/5 bilateral knee flexion and extension, + patellar and achilles reflexes bilaterally, normal knee range of motion, no knee effusion noted, neurovasculary intact, intact sensation in bilateral LE extremities, no point tenderness of bilateral knees or back 4/5 right hip flexion, hip abduction Equivical straight leg raise bilaterally Reproducible pain with lumbar flexion and extension No midline tenderness    Assessment & Plan:    Back pain with right-sided sciatica History and physical exam consistent with back pain with sciatica. There is some concern for bulging disk but her back pain with backward extension concerning for alternate or additional back pathology - will obtain Xray and MRI of her back as her pain has been significant and limiting  of her usual activities - continue NSAIDs as needed for pain - return after imaging complete    [name] A. Lincoln Brigham MD, [facility] Resident PGY-3 Pager [phone]  Patient seen and evaluated with the resident. I agree with the above plan of care. X-rays of her lumbar spine are unremarkable. Given her weakness on today's exam, I think we should proceed with an MRI scan to rule out a lumbar disc herniation. She has been doing home PT for the past 2 months. She is not getting any better. Once I'm able to review her MRI, I will follow-up with her via telephone and we will delineate further treatment. Patient's pain is worse sitting which makes me suspicious of a lumbar disc herniation but the fact that she gets pain with extension suggests that she may also be getting facet mediated pain as well.

## 2016-06-07 ENCOUNTER — Encounter: Payer: Self-pay | Admitting: Sports Medicine

## 2016-06-09 ENCOUNTER — Ambulatory Visit
Admission: RE | Admit: 2016-06-09 | Discharge: 2016-06-09 | Disposition: A | Discharge status: Home / Self Care | Payer: 59 | Source: Ambulatory Visit | Attending: Sports Medicine | Admitting: Sports Medicine

## 2016-06-09 DIAGNOSIS — G8929 Other chronic pain: Secondary | ICD-10-CM

## 2016-06-09 DIAGNOSIS — M48061 Spinal stenosis, lumbar region without neurogenic claudication: Secondary | ICD-10-CM | POA: Diagnosis not present

## 2016-06-09 DIAGNOSIS — M5441 Lumbago with sciatica, right side: Principal | ICD-10-CM

## 2016-06-11 ENCOUNTER — Ambulatory Visit (INDEPENDENT_AMBULATORY_CARE_PROVIDER_SITE_OTHER): Payer: 59 | Admitting: Sports Medicine

## 2016-06-11 DIAGNOSIS — M5416 Radiculopathy, lumbar region: Secondary | ICD-10-CM

## 2016-06-11 MED ORDER — MELOXICAM 15 MG PO TABS: 15.0000 mg | ORAL_TABLET | Freq: Every day | ORAL | 0 refills | Status: DC | PRN

## 2016-06-12 NOTE — Progress Notes (Signed)
Patient ID: SYLVETTE MORACE, female   DOB: February 15, 1966, 51 y.o.   MRN: [id number]   Patient comes in today to discuss MRI findings of her lumbar spine. 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 Very mild facet hypertrophy at L5-S1 as well. Overall, her pain has improved. At my request, she stopped doing the exercises that she was doing prior to her visit with me. The pain that she was getting into her right leg has resolved. She still has some stiffness and discomfort in her low back. She has been able to return to running. In fact, she was able to run 4 miles the other day. She was a little sore afterwards. She is concerned that her pain will return if she increases her distance. She has also just completed 15 days of meloxicam and she found that to be extremely helpful.  Physical exam was not repeated. We simply talked about her MRI and treatment going forward. I think she would benefit tremendously from some physical therapy. I've referred her to Mali Parker at [facility]. I've also given her a refill on her meloxicam to take as needed. She was asking about possible treatment in the future if her pain once again worsens. I've explained to her that we could consider either an IM Depo-Medrol injection or a Sterapred Dosepak if her symptoms become severe again. Follow-up as needed.  Total times that with the patient was 15 minutes with greater than 50% of the time spent in face-to-face consultation discussing her diagnosis and treatment.

## 2016-06-16 DIAGNOSIS — M545 Low back pain: Secondary | ICD-10-CM | POA: Diagnosis not present

## 2016-06-18 DIAGNOSIS — M545 Low back pain: Secondary | ICD-10-CM | POA: Diagnosis not present

## 2016-06-19 ENCOUNTER — Other Ambulatory Visit: Payer: Self-pay | Admitting: *Deleted

## 2016-06-19 ENCOUNTER — Encounter: Payer: Self-pay | Admitting: Sports Medicine

## 2016-06-19 MED ORDER — PREDNISONE 10 MG (21) PO TBPK: 10.0000 mg | ORAL_TABLET | Freq: Every day | ORAL | 0 refills | Status: DC

## 2016-06-25 ENCOUNTER — Encounter: Payer: Self-pay | Admitting: Sports Medicine

## 2016-06-25 ENCOUNTER — Ambulatory Visit (INDEPENDENT_AMBULATORY_CARE_PROVIDER_SITE_OTHER): Payer: 59 | Admitting: Sports Medicine

## 2016-06-25 VITALS — BP 100/60 | Ht 62.0 in | Wt 115.0 lb

## 2016-06-25 DIAGNOSIS — M5416 Radiculopathy, lumbar region: Secondary | ICD-10-CM | POA: Diagnosis not present

## 2016-06-26 NOTE — Progress Notes (Signed)
   Subjective:    Patient ID: Erin Weaver, female    DOB: [birth year], 51 y.o.   MRN: [id number]  HPI   Patient comes in today for follow-up on low back pain and right leg pain. She called the office last week complaining of severe pain after physical therapy. We called in a Sterapred Dosepak but she elected not to take it yet. Since that time, her pain has improved. She still has a dull ache along the right side of her low back. Previous pain she was getting in her right leg has subsided but she has constant numbness now along her proximal thigh. She denies numbness elsewhere in the leg. She has not noticed any weakness. She has continued to remain active including playing tennis and states that her symptoms are most noticeable when sitting on a hard surface. Previous MRI of her lumbar spine showed some disc desiccation at L4-L5 and moderate bilateral stenosis at this level but she also had evidence of a small right-sided foraminal disc protrusion at L1-L2 but this does not appear to cause any significant stenosis. She is taking meloxicam with some benefit.      Review of Systems As above    Objective:   Physical Exam  Well-developed, well-nourished. No acute distress. She is able to sit in the exam room but at times will become uncomfortable and need to stand  Good lumbar range of motion. Negative straight leg raise bilaterally. Decreased sensation to light touch along the L1-L2 dermatome on the right. Good strength. Reflexes are equal. Neurovascularly intact distally.  MRI of her lumbar spine is as above      Assessment & Plan:   Low back pain with right leg radiculopathy  Although I had originally thought her symptoms were originating from the L4-L5 level, her numbness makes me think that it is the small disc protrusion at L1-L2. Since physical therapy made her symptoms worse, she will discontinue that for now. I will inject her with 80 mg of Depo-Medrol IM today. If she does not  notice any benefit from this injection over the next 24-48 hours she will start the 6 day Sterapred Dosepak but will stop her meloxicam. She will follow-up with me in 2 weeks. If symptoms persist then I would consider referral to Dr Ron Agee for his input. I've encouraged her to call me with questions or concerns prior to her follow-up visit.

## 2016-07-01 DIAGNOSIS — L821 Other seborrheic keratosis: Secondary | ICD-10-CM | POA: Diagnosis not present

## 2016-07-01 DIAGNOSIS — L814 Other melanin hyperpigmentation: Secondary | ICD-10-CM | POA: Diagnosis not present

## 2016-07-01 DIAGNOSIS — D2262 Melanocytic nevi of left upper limb, including shoulder: Secondary | ICD-10-CM | POA: Diagnosis not present

## 2016-07-09 ENCOUNTER — Encounter: Payer: Self-pay | Admitting: Sports Medicine

## 2016-07-09 ENCOUNTER — Ambulatory Visit: Payer: 59 | Admitting: Sports Medicine

## 2016-07-09 ENCOUNTER — Ambulatory Visit (INDEPENDENT_AMBULATORY_CARE_PROVIDER_SITE_OTHER): Payer: 59 | Admitting: Sports Medicine

## 2016-07-09 VITALS — BP 100/64 | Ht 62.0 in | Wt 115.0 lb

## 2016-07-09 DIAGNOSIS — M5416 Radiculopathy, lumbar region: Secondary | ICD-10-CM | POA: Diagnosis not present

## 2016-07-09 NOTE — Patient Instructions (Signed)
Dr. Shelda Altes at [facility] [place] [address] [phone]  Wednesday 07/22/16 @ 4p; Arrival time is 345pm

## 2016-07-10 NOTE — Progress Notes (Signed)
Patient ID: Erin Weaver, female   DOB: April 11, 1966, 51 y.o.   MRN: [id number]  Patient comes in today for follow-up on right leg sciatica. She continues to have significant discomfort. She continues to localize her pain to the posterior right hip with radiation into the anterior thigh. She has constant numbness across her groin. Her symptoms are most severe with prolonged sitting. They improve with standing and with activity. She is able to run and play tennis with little discomfort. Recent IM cortisone injection helped some but certainly was not curative. Previous MRI of her lumbar spine showed some foraminal disc desiccation at L4-L5 and a small right-sided foraminal disc protrusion at L1-L2.  Physical exam was not repeated today. We simply discussed referral to Dr.Ibazebo at [facility]. I think her symptoms may be originating from the L1-L2 disc protrusion and she may be a candidate for a diagnostic epidural injection at this level. However, I would like to have Dr.Ibazebo evaluate the patient and determine whether or not this is an appropriate next step. Patient is in agreement with that plan. I will defer further workup and treatment to the discretion of Dr.Ibazebo and the patient will follow-up with me as needed.  Total time spent with the patient was 10 minutes with greater than 50% of the time spent in face-to-face consultation discussing her ongoing dilemma and referral to physiatry.

## 2016-07-23 DIAGNOSIS — M545 Low back pain: Secondary | ICD-10-CM | POA: Diagnosis not present

## 2016-07-23 DIAGNOSIS — M5136 Other intervertebral disc degeneration, lumbar region: Secondary | ICD-10-CM | POA: Diagnosis not present

## 2016-07-23 DIAGNOSIS — M5116 Intervertebral disc disorders with radiculopathy, lumbar region: Secondary | ICD-10-CM | POA: Diagnosis not present

## 2016-10-06 ENCOUNTER — Other Ambulatory Visit: Payer: Self-pay | Admitting: Family Medicine

## 2016-10-06 ENCOUNTER — Other Ambulatory Visit: Payer: Self-pay | Admitting: Obstetrics and Gynecology

## 2016-10-06 DIAGNOSIS — Z1231 Encounter for screening mammogram for malignant neoplasm of breast: Secondary | ICD-10-CM

## 2016-11-10 ENCOUNTER — Ambulatory Visit
Admission: RE | Admit: 2016-11-10 | Discharge: 2016-11-10 | Disposition: A | Discharge status: Home / Self Care | Payer: 59 | Source: Ambulatory Visit | Attending: Obstetrics and Gynecology | Admitting: Obstetrics and Gynecology

## 2016-11-10 DIAGNOSIS — Z1231 Encounter for screening mammogram for malignant neoplasm of breast: Secondary | ICD-10-CM | POA: Diagnosis not present

## 2017-01-21 DIAGNOSIS — Z01419 Encounter for gynecological examination (general) (routine) without abnormal findings: Secondary | ICD-10-CM | POA: Diagnosis not present

## 2017-01-21 DIAGNOSIS — Z6820 Body mass index (BMI) 20.0-20.9, adult: Secondary | ICD-10-CM | POA: Diagnosis not present

## 2017-02-22 ENCOUNTER — Encounter: Payer: Self-pay | Admitting: Family Medicine

## 2017-03-08 ENCOUNTER — Other Ambulatory Visit: Payer: Self-pay

## 2017-03-08 DIAGNOSIS — M5416 Radiculopathy, lumbar region: Secondary | ICD-10-CM

## 2017-03-08 MED ORDER — MELOXICAM 15 MG PO TABS: 15.0000 mg | ORAL_TABLET | Freq: Every day | ORAL | 0 refills | Status: DC | PRN

## 2017-03-17 NOTE — Progress Notes (Signed)
Erin Weaver received a flu shot to LT deltoid at [place] 10 [id number] Mfg: [employer] [place]: [phone] Exp. 11/14/17

## 2017-03-19 ENCOUNTER — Emergency Department (HOSPITAL_COMMUNITY)
Admission: EM | Admit: 2017-03-19 | Discharge: 2017-03-19 | Disposition: A | Discharge status: Home / Self Care | Payer: 59 | Attending: Emergency Medicine | Admitting: Emergency Medicine

## 2017-03-19 ENCOUNTER — Encounter (HOSPITAL_COMMUNITY): Payer: Self-pay

## 2017-03-19 DIAGNOSIS — R51 Headache: Secondary | ICD-10-CM | POA: Diagnosis not present

## 2017-03-19 DIAGNOSIS — Y929 Unspecified place or not applicable: Secondary | ICD-10-CM | POA: Diagnosis not present

## 2017-03-19 DIAGNOSIS — M25512 Pain in left shoulder: Secondary | ICD-10-CM | POA: Insufficient documentation

## 2017-03-19 DIAGNOSIS — Y9389 Activity, other specified: Secondary | ICD-10-CM | POA: Insufficient documentation

## 2017-03-19 DIAGNOSIS — W2210XA Striking against or struck by unspecified automobile airbag, initial encounter: Secondary | ICD-10-CM | POA: Insufficient documentation

## 2017-03-19 DIAGNOSIS — Y998 Other external cause status: Secondary | ICD-10-CM | POA: Diagnosis not present

## 2017-03-19 DIAGNOSIS — M542 Cervicalgia: Secondary | ICD-10-CM | POA: Insufficient documentation

## 2017-03-19 MED ORDER — IBUPROFEN 400 MG PO TABS
600.0000 mg | ORAL_TABLET | Freq: Once | ORAL | Status: AC
  Administered 2017-03-19: 600 mg via ORAL
  Filled 2017-03-19: qty 1

## 2017-03-19 MED ORDER — CYCLOBENZAPRINE HCL 10 MG PO TABS: 10.0000 mg | ORAL_TABLET | Freq: Every evening | ORAL | 0 refills | Status: DC | PRN

## 2017-03-19 NOTE — Discharge Instructions (Signed)
Please read instructions below. Apply ice to your shoulder for 20 minutes at a time. You can also apply heat if this provides you with more relief. Drink plenty of water. You can take advil every 6 hours as needed for pain. You can take 1/2 - 1 tab of flexeril at bedtime as needed for muscle spasm.  You will be sore the next few days and that is normal.  Limit your screen time and complex thinking. Avoid contact sports for at least one week and until you have been cleared by your primary care provider. Return to the ER for severe headache, vision changes, vomiting, numbness or tingling in your extremities, bowel or bladder incontinence, or new or concerning symptoms.
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
## 2017-09-16 DIAGNOSIS — M9903 Segmental and somatic dysfunction of lumbar region: Secondary | ICD-10-CM | POA: Diagnosis not present

## 2017-09-16 DIAGNOSIS — M5442 Lumbago with sciatica, left side: Secondary | ICD-10-CM | POA: Diagnosis not present

## 2017-09-16 DIAGNOSIS — M9904 Segmental and somatic dysfunction of sacral region: Secondary | ICD-10-CM | POA: Diagnosis not present

## 2017-09-20 DIAGNOSIS — M9903 Segmental and somatic dysfunction of lumbar region: Secondary | ICD-10-CM | POA: Diagnosis not present

## 2017-09-20 DIAGNOSIS — M9904 Segmental and somatic dysfunction of sacral region: Secondary | ICD-10-CM | POA: Diagnosis not present

## 2017-09-20 DIAGNOSIS — M5442 Lumbago with sciatica, left side: Secondary | ICD-10-CM | POA: Diagnosis not present

## 2017-09-23 DIAGNOSIS — M9904 Segmental and somatic dysfunction of sacral region: Secondary | ICD-10-CM | POA: Diagnosis not present

## 2017-09-23 DIAGNOSIS — M5442 Lumbago with sciatica, left side: Secondary | ICD-10-CM | POA: Diagnosis not present

## 2017-09-23 DIAGNOSIS — M9903 Segmental and somatic dysfunction of lumbar region: Secondary | ICD-10-CM | POA: Diagnosis not present

## 2017-09-29 DIAGNOSIS — M5442 Lumbago with sciatica, left side: Secondary | ICD-10-CM | POA: Diagnosis not present

## 2017-09-29 DIAGNOSIS — M9904 Segmental and somatic dysfunction of sacral region: Secondary | ICD-10-CM | POA: Diagnosis not present

## 2017-09-29 DIAGNOSIS — M9903 Segmental and somatic dysfunction of lumbar region: Secondary | ICD-10-CM | POA: Diagnosis not present

## 2017-10-07 DIAGNOSIS — M9903 Segmental and somatic dysfunction of lumbar region: Secondary | ICD-10-CM | POA: Diagnosis not present

## 2017-10-07 DIAGNOSIS — M9904 Segmental and somatic dysfunction of sacral region: Secondary | ICD-10-CM | POA: Diagnosis not present

## 2017-10-07 DIAGNOSIS — M5442 Lumbago with sciatica, left side: Secondary | ICD-10-CM | POA: Diagnosis not present

## 2017-10-12 ENCOUNTER — Other Ambulatory Visit: Payer: Self-pay | Admitting: Obstetrics and Gynecology

## 2017-10-12 DIAGNOSIS — Z1231 Encounter for screening mammogram for malignant neoplasm of breast: Secondary | ICD-10-CM

## 2017-10-14 DIAGNOSIS — M9903 Segmental and somatic dysfunction of lumbar region: Secondary | ICD-10-CM | POA: Diagnosis not present

## 2017-10-14 DIAGNOSIS — M9904 Segmental and somatic dysfunction of sacral region: Secondary | ICD-10-CM | POA: Diagnosis not present

## 2017-10-14 DIAGNOSIS — M5442 Lumbago with sciatica, left side: Secondary | ICD-10-CM | POA: Diagnosis not present

## 2017-11-02 DIAGNOSIS — M9904 Segmental and somatic dysfunction of sacral region: Secondary | ICD-10-CM | POA: Diagnosis not present

## 2017-11-02 DIAGNOSIS — M9903 Segmental and somatic dysfunction of lumbar region: Secondary | ICD-10-CM | POA: Diagnosis not present

## 2017-11-02 DIAGNOSIS — M5442 Lumbago with sciatica, left side: Secondary | ICD-10-CM | POA: Diagnosis not present

## 2017-11-10 DIAGNOSIS — M9903 Segmental and somatic dysfunction of lumbar region: Secondary | ICD-10-CM | POA: Diagnosis not present

## 2017-11-10 DIAGNOSIS — M5442 Lumbago with sciatica, left side: Secondary | ICD-10-CM | POA: Diagnosis not present

## 2017-11-10 DIAGNOSIS — M9904 Segmental and somatic dysfunction of sacral region: Secondary | ICD-10-CM | POA: Diagnosis not present

## 2017-11-11 ENCOUNTER — Ambulatory Visit
Admission: RE | Admit: 2017-11-11 | Discharge: 2017-11-11 | Disposition: A | Discharge status: Home / Self Care | Payer: 59 | Source: Ambulatory Visit | Attending: Obstetrics and Gynecology | Admitting: Obstetrics and Gynecology

## 2017-11-11 DIAGNOSIS — Z1231 Encounter for screening mammogram for malignant neoplasm of breast: Secondary | ICD-10-CM

## 2017-12-09 IMAGING — DX DG LUMBAR SPINE 2-3V
3 series · 3 of 3 positions shown · non-contrast
Comparison: CT abdomen and pelvis with bony reformats March 23, 2016

CLINICAL DATA: Lumbago

EXAM:
LUMBAR SPINE - 2-3 VIEW

[dg lumbar spine 2-3 views (1 of 3)]
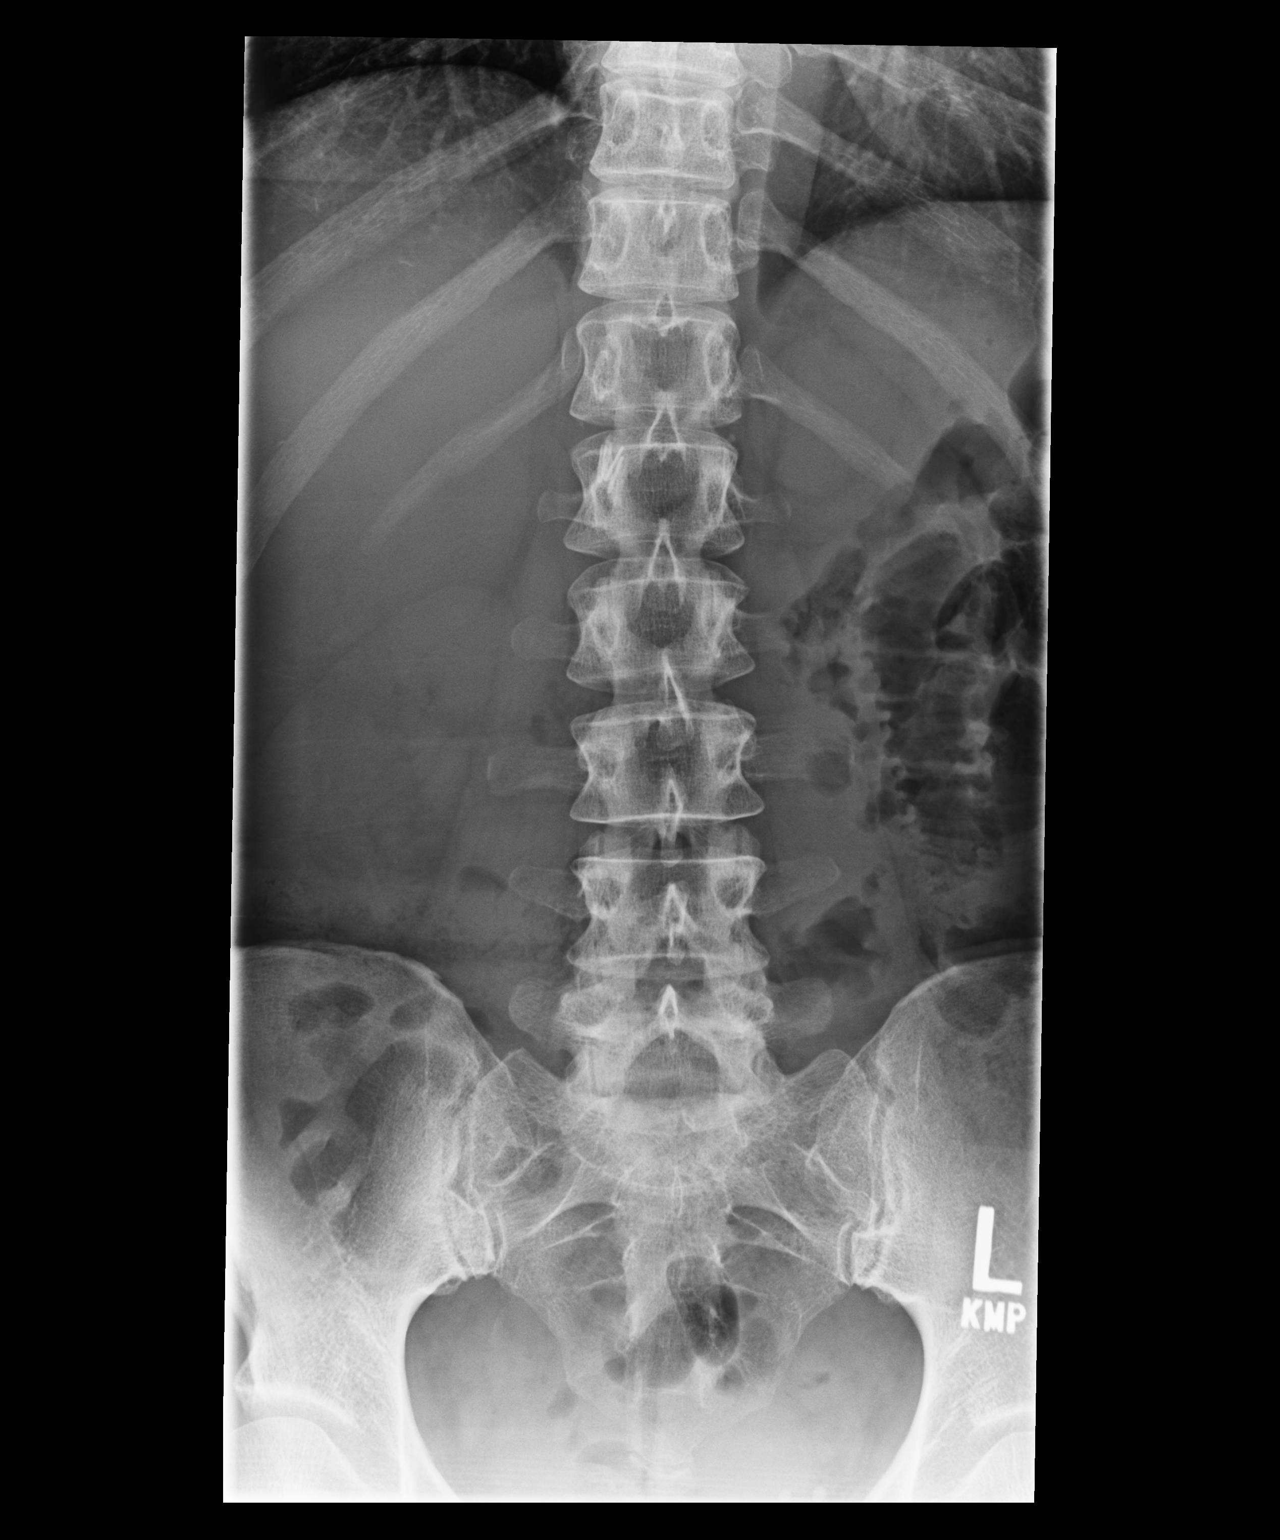

[dg lumbar spine 2-3 views (2 of 3)]
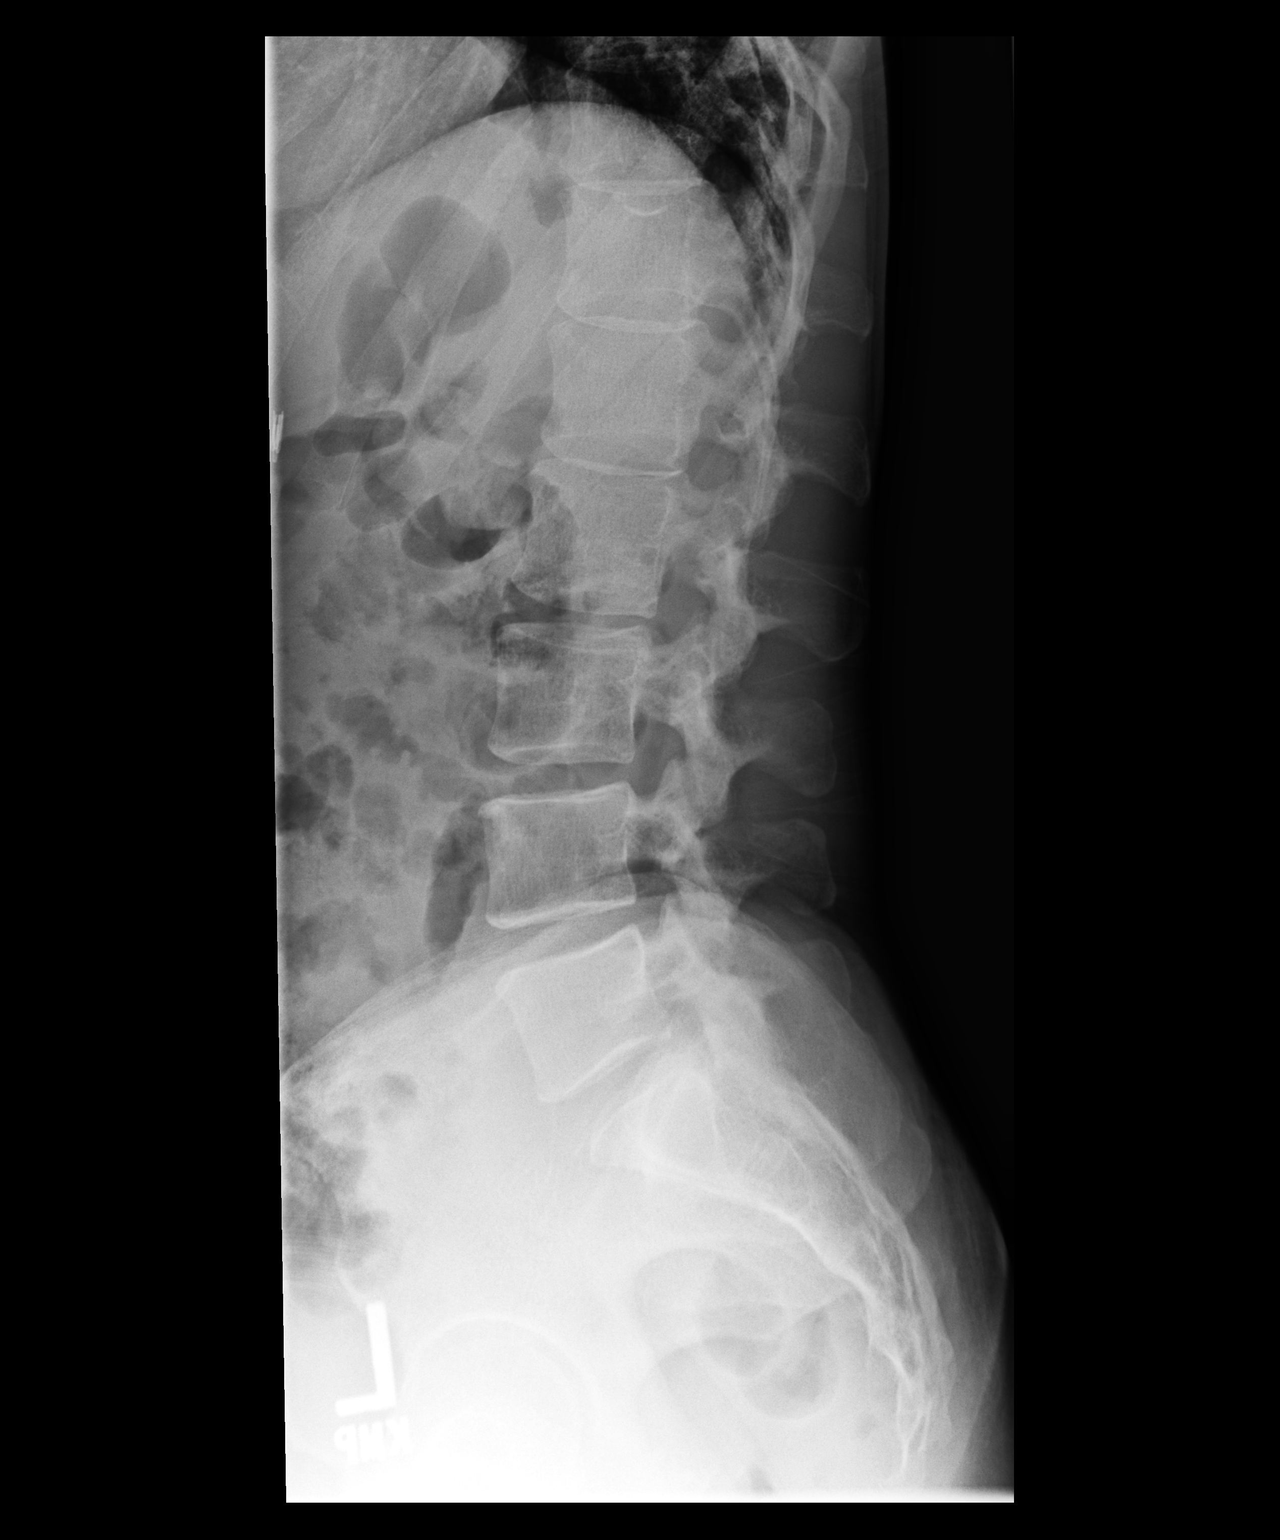

[dg lumbar spine 2-3 views (3 of 3)]
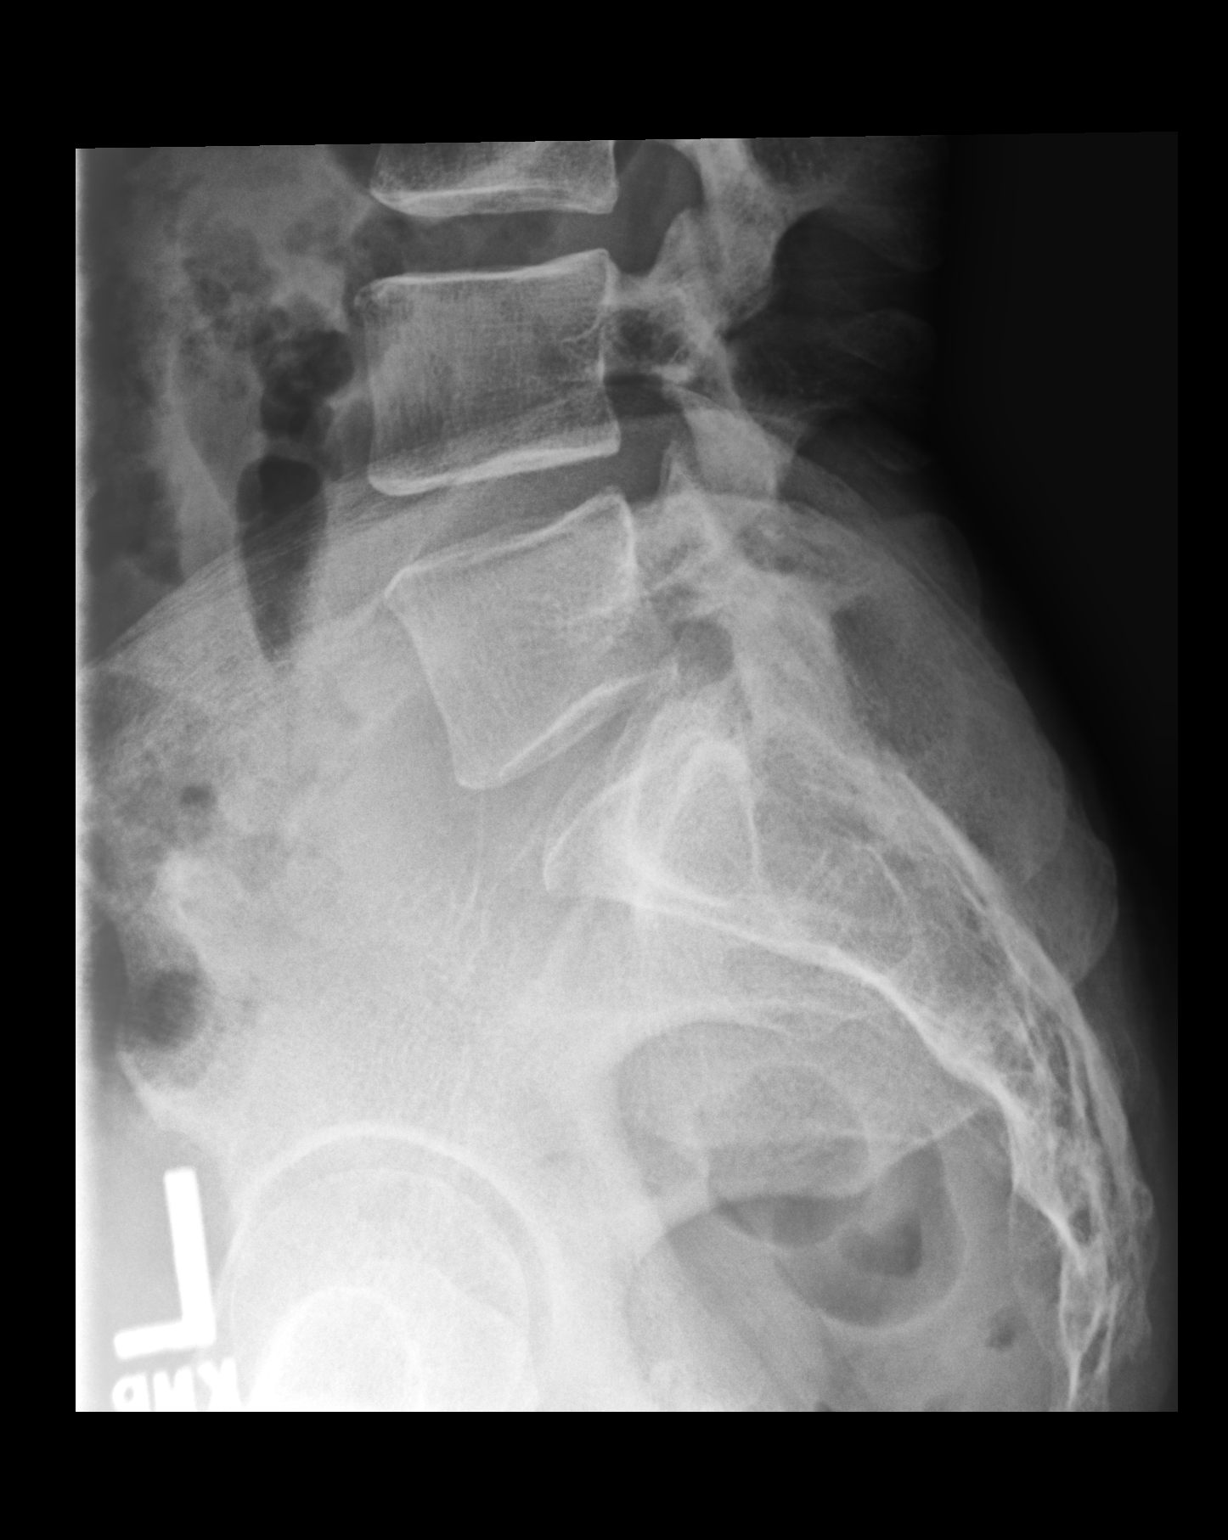

[3 of 3 positions shown; findings below may reference images not displayed]

FINDINGS: Frontal, lateral, and spot lumbosacral lateral images were obtained.
There are 5 non-rib-bearing lumbar type vertebral bodies. There is
no fracture or spondylolisthesis. The disc spaces appear normal. No
erosive change.
IMPRESSION: No fracture or spondylolisthesis.  No evident arthropathy.

## 2018-01-30 ENCOUNTER — Encounter: Payer: Self-pay | Admitting: Family Medicine
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
## 2018-02-01 NOTE — Patient Instructions (Addendum)
  HEALTH MAINTENANCE RECOMMENDATIONS:  It is recommended that you get at least 30 minutes of aerobic exercise at least 5 days/week (for weight loss, you may need as much as 60-90 minutes). This can be any activity that gets your heart rate up. This can be divided in 10-15 minute intervals if needed, but try and build up your endurance at least once a week.  Weight bearing exercise is also recommended twice weekly.  Eat a healthy diet with lots of vegetables, fruits and fiber.  "Colorful" foods have a lot of vitamins (ie green vegetables, tomatoes, red peppers, etc).  Limit sweet tea, regular sodas and alcoholic beverages, all of which has a lot of calories and sugar.  Up to 1 alcoholic drink daily may be beneficial for women (unless trying to lose weight, watch sugars).  Drink a lot of water.  Calcium recommendations are 1200-1500 mg daily (1500 mg for postmenopausal women or women without ovaries), and vitamin D 1000 IU daily.  This should be obtained from diet and/or supplements (vitamins), and calcium should not be taken all at once, but in divided doses.  Monthly self breast exams and yearly mammograms for women over the age of 82 is recommended.  Sunscreen of at least SPF 30 should be used on all sun-exposed parts of the skin when outside between the hours of 10 am and 4 pm (not just when at beach or pool, but even with exercise, golf, tennis, and yard work!)  Use a sunscreen that says "broad spectrum" so it covers both UVA and UVB rays, and make sure to reapply every 1-2 hours.  Remember to change the batteries in your smoke detectors when changing your clock times in the spring and fall.  Carbon monoxide detectors are recommended for your home.  Use your seat belt every time you are in a car, and please drive safely and not be distracted with cell phones and texting while driving.  I recommend getting the new shingles vaccine (Shingrix). You will need to check with your insurance to see if it  is covered, and then schedule nurse visit to receive the vaccine.  It is a series of 2 injections, spaced 2 months apart. Side effects were reviewed today.  Keep track of your chest tightening symptoms.  Try and see if there are any particular triggers.  If any increase in frequency, severity, or other associated symptoms develop, please let me know.

## 2018-02-02 ENCOUNTER — Ambulatory Visit: Payer: 59 | Admitting: Family Medicine

## 2018-02-02 ENCOUNTER — Encounter: Payer: Self-pay | Admitting: Family Medicine

## 2018-02-02 VITALS — BP 90/60 | HR 56 | Ht 62.0 in | Wt 110.2 lb

## 2018-02-02 DIAGNOSIS — Z8659 Personal history of other mental and behavioral disorders: Secondary | ICD-10-CM

## 2018-02-02 DIAGNOSIS — R079 Chest pain, unspecified: Secondary | ICD-10-CM | POA: Diagnosis not present

## 2018-02-02 DIAGNOSIS — Z0000 Encounter for general adult medical examination without abnormal findings: Secondary | ICD-10-CM

## 2018-02-02 DIAGNOSIS — G43909 Migraine, unspecified, not intractable, without status migrainosus: Secondary | ICD-10-CM

## 2018-02-02 DIAGNOSIS — R5383 Other fatigue: Secondary | ICD-10-CM

## 2018-02-02 DIAGNOSIS — Z1211 Encounter for screening for malignant neoplasm of colon: Secondary | ICD-10-CM

## 2018-02-02 DIAGNOSIS — Z23 Encounter for immunization: Secondary | ICD-10-CM | POA: Diagnosis not present

## 2018-02-02 LAB — POCT URINALYSIS DIP (PROADVANTAGE DEVICE)
BILIRUBIN UA: NEGATIVE
Glucose, UA: NEGATIVE mg/dL
Ketones, POC UA: NEGATIVE mg/dL
Leukocytes, UA: NEGATIVE
NITRITE UA: NEGATIVE
PH UA: 6 (ref 5.0–8.0)
Protein Ur, POC: NEGATIVE mg/dL
RBC UA: NEGATIVE
SPECIFIC GRAVITY, URINE: 1.01
Urobilinogen, Ur: NEGATIVE

## 2018-02-21 ENCOUNTER — Other Ambulatory Visit (INDEPENDENT_AMBULATORY_CARE_PROVIDER_SITE_OTHER): Payer: 59

## 2018-02-21 DIAGNOSIS — Z23 Encounter for immunization: Secondary | ICD-10-CM | POA: Diagnosis not present

## 2018-02-22 ENCOUNTER — Other Ambulatory Visit: Payer: 59

## 2018-03-10 DIAGNOSIS — Z1211 Encounter for screening for malignant neoplasm of colon: Secondary | ICD-10-CM | POA: Diagnosis not present

## 2018-03-10 DIAGNOSIS — R197 Diarrhea, unspecified: Secondary | ICD-10-CM | POA: Diagnosis not present

## 2018-04-04 DIAGNOSIS — Z6821 Body mass index (BMI) 21.0-21.9, adult: Secondary | ICD-10-CM | POA: Diagnosis not present

## 2018-04-04 DIAGNOSIS — Z01419 Encounter for gynecological examination (general) (routine) without abnormal findings: Secondary | ICD-10-CM | POA: Diagnosis not present

## 2018-04-06 DIAGNOSIS — Z1211 Encounter for screening for malignant neoplasm of colon: Secondary | ICD-10-CM | POA: Diagnosis not present

## 2018-04-06 LAB — HM COLONOSCOPY

## 2018-04-07 ENCOUNTER — Encounter: Payer: Self-pay | Admitting: *Deleted

## 2018-05-27 DIAGNOSIS — L239 Allergic contact dermatitis, unspecified cause: Secondary | ICD-10-CM | POA: Diagnosis not present

## 2018-06-23 DIAGNOSIS — L821 Other seborrheic keratosis: Secondary | ICD-10-CM | POA: Diagnosis not present

## 2018-06-23 DIAGNOSIS — D225 Melanocytic nevi of trunk: Secondary | ICD-10-CM | POA: Diagnosis not present

## 2018-06-23 DIAGNOSIS — L239 Allergic contact dermatitis, unspecified cause: Secondary | ICD-10-CM | POA: Diagnosis not present

## 2018-06-29 ENCOUNTER — Telehealth: Payer: Self-pay | Admitting: Family Medicine

## 2018-06-29 MED ORDER — SCOPOLAMINE 1 MG/3DAYS TD PT72: 1.0000 | MEDICATED_PATCH | TRANSDERMAL | 0 refills | Status: DC

## 2018-06-29 NOTE — Telephone Encounter (Signed)
Pt counseled re: risks and SE, and also about oral meclizine prn which she can try instead.

## 2018-08-01 ENCOUNTER — Other Ambulatory Visit: Payer: Self-pay

## 2018-08-01 DIAGNOSIS — M5416 Radiculopathy, lumbar region: Secondary | ICD-10-CM

## 2018-08-01 MED ORDER — MELOXICAM 15 MG PO TABS: 15.0000 mg | ORAL_TABLET | Freq: Every day | ORAL | 0 refills | Status: DC | PRN

## 2018-11-10 ENCOUNTER — Other Ambulatory Visit: Payer: Self-pay

## 2018-11-10 ENCOUNTER — Ambulatory Visit (INDEPENDENT_AMBULATORY_CARE_PROVIDER_SITE_OTHER): Payer: 59 | Admitting: Family Medicine

## 2018-11-10 ENCOUNTER — Encounter: Payer: Self-pay | Admitting: Family Medicine

## 2018-11-10 VITALS — Ht 62.0 in | Wt 110.2 lb

## 2018-11-10 DIAGNOSIS — M79605 Pain in left leg: Secondary | ICD-10-CM | POA: Diagnosis not present
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
## 2018-12-09 ENCOUNTER — Other Ambulatory Visit: Payer: Self-pay

## 2018-12-09 ENCOUNTER — Other Ambulatory Visit (INDEPENDENT_AMBULATORY_CARE_PROVIDER_SITE_OTHER): Payer: 59

## 2018-12-09 DIAGNOSIS — Z23 Encounter for immunization: Secondary | ICD-10-CM | POA: Diagnosis not present

## 2018-12-12 ENCOUNTER — Other Ambulatory Visit: Payer: Self-pay | Admitting: Obstetrics and Gynecology

## 2018-12-12 DIAGNOSIS — Z1231 Encounter for screening mammogram for malignant neoplasm of breast: Secondary | ICD-10-CM

## 2019-01-02 ENCOUNTER — Encounter: Payer: Self-pay | Admitting: Family Medicine

## 2019-01-02 ENCOUNTER — Other Ambulatory Visit: Payer: Self-pay

## 2019-01-02 ENCOUNTER — Ambulatory Visit: Payer: 59 | Admitting: Family Medicine

## 2019-01-02 VITALS — Ht 62.0 in

## 2019-01-02 DIAGNOSIS — R059 Cough, unspecified: Secondary | ICD-10-CM

## 2019-01-02 DIAGNOSIS — R05 Cough: Secondary | ICD-10-CM | POA: Diagnosis not present

## 2019-01-02 DIAGNOSIS — S91311A Laceration without foreign body, right foot, initial encounter: Secondary | ICD-10-CM

## 2019-01-02 NOTE — Patient Instructions (Signed)
We discussed soaking the right foot today in warm soapy water, followed by irrigation of the wound with plain water (vs saline), to try and rinse out any debris that may still be present. After doing this once, use antibiotic ointment 2-3 times daily. You may leave it open to air when elevated, but when walking on it, keep it covered with a bandage to keep it protected.  If it develop drainage, crusting, redness, warmth, swelling or if you develop a fever, these are symptoms of infection, and needs to be brought to my attention for further treatment.  The differential diagnosis for your chest discomfort, "almost cough" is either postnasal drainage (most likely from allergies) versus reflux.  Given that I have heard you sneeze and clear your throat some, let's start with an antihistamine first.  If what you have makes you sleepy (ie it may be zyrtec/cetirizine), then you can try taking it at bedtime. If you remain sleepy during the day, change instead to claritin or allegra, which shouldn't make you as sleepy.  If your symptoms stay the same, then it might be reflux contributing to this symptom (even without true heartburn).  You can potentially try a course of pepcid versus prilosec OTC or Nexium 24 hr to see if this alleviates your symptoms.  I'd start with the antithistamine first, but try this next if your symptoms persist.

## 2019-01-02 NOTE — Progress Notes (Signed)
Start time: 12:16 End time: 12:34   Virtual Visit via Video Note  I connected with Erin Weaver on 01/02/19 by a video enabled telemedicine application and verified that I am speaking with the correct person using two identifiers.  Location: Patient: home, daughters present Provider: office   I discussed the limitations of evaluation and management by telemedicine and the availability of in person appointments. The patient expressed understanding and agreed to proceed.  History of Present Illness:  Chief Complaint  Patient presents with  . Laceration    cut on right foot that happened yesterday. Has been using a pain spray on it and neosporin, she is sure there is still sand in it.   . Cough    doesn't believe it's COVID, it's her only symptom. But is definetly nervous about it.    Stepped on shell yesterday while at the beach, and cut the bottom of her right foot (at base of great toe).  It is over an inch long, and thinks it is deep. Husband rinsed it with running water bottle. Thinks there may be sand still in the wound, despite also soaking it. No fever, redness, swelling.  Bled initially, but no ongoing bleeding.    "almost cough"  She has described to me for weeks feeling like she might need to cough, feeling like she could (but not actually coughing). This hasn't progressed to a cough. Has had intermittent symptoms like this for over a year, but more often recently.  She worries a lot about getting COVID, and keeps a running list of her contacts just in case.  Gets a discomfort in her chest which makes her feel like she needs to cough. Feels like she needs to cough, but isn't really coughing. Part of her thinks it is sometimes triggered by food, sometimes happens after eating.  Denies any heartburn, reflux, and is inconsistent with meals.(right now--hasn't eaten). While I have been on runs with her, I will hear some throat-clearing, and an occasional sneeze--and have  suggested taking antihistamines to see if this helps clear up her symptoms.  She doesn't take medications regularly, and hasn't tried this yet. She recalls taking one she has at home, but that it made her too sleepy (?zyrtec, doesn't recall).  PMH, PSH, SH reviewed  Outpatient Encounter Medications as of 01/02/2019  Medication Sig Note  . ibuprofen (ADVIL,MOTRIN) 200 MG tablet Take 200 mg by mouth every 6 (six) hours as needed.   . meloxicam (MOBIC) 15 MG tablet Take 1 tablet (15 mg total) by mouth daily as needed for pain.   Marland Kitchen acetaminophen (TYLENOL) 325 MG tablet Take 650 mg by mouth every 6 (six) hours as needed.   . diphenhydramine-acetaminophen (TYLENOL PM) 25-500 MG TABS tablet Take 1 tablet by mouth at bedtime as needed. 02/02/2018: Uses prn with migraines  . escitalopram (LEXAPRO) 10 MG tablet  01/02/2019: Hasn't taken in the last 13 months  . naproxen sodium (ALEVE) 220 MG tablet Take 220-440 mg by mouth.   . tretinoin (RETIN-A) 0.025 % cream APPLY TO AFFECTED AREAS ON FACE NIGHTLY AT BEDTIME UTD   . [DISCONTINUED] scopolamine (TRANSDERM-SCOP, 1.5 MG,) 1 MG/3DAYS Place 1 patch (1.5 mg total) onto the skin every 3 (three) days.    No facility-administered encounter medications on file as of 01/02/2019.    Allergies  Allergen Reactions  . Erythromycin Rash  . Sulfa Antibiotics Rash   ROS: No shortness of breath, body aches, loss of smell/taste. No GI complaints or other  concerns. No headaches, dizziness, body aches.  No sick contacts.    Observations/Objective:  Ht 5\' 2"  (1.575 m)   BMI 20.16 kg/m   Well appearing, pleasant female, in no distress Examination of the bottom of her right foot shows a superficial laceration measuring about an inch over the bottom of her right foot at MTP area (base of great toe). There is no erythema, bleeding, drainage or visible debris. No surrounding soft tissue swelling  She is alert, oriented. Cranial nerves are grossly intact.   No coughing  during visit. Speaking comfortably in long sentences.   Assessment and Plan:  Laceration of right foot, initial encounter - DOI 01/01/2019. no e/o infection, superficial, no need for closure. To soak, flush with syringe, then use antibacterial ointment. s/sx infection reviewed   Cough - (barely)--Ddx includes PND vs reflux.  Suspect allergies. Rec OTC antihistamine daily--change zyrtec to qHS vs try nonsedating claritin or allegra. Consider PPI -  Recommended daily antihistamine, reflux treatment if no improvement.   Discussed wound care   Follow Up Instructions:    I discussed the assessment and treatment plan with the patient. The patient was provided an opportunity to ask questions and all were answered. The patient agreed with the plan and demonstrated an understanding of the instructions.   The patient was advised to call back or seek an in-person evaluation if the symptoms worsen or if the condition fails to improve as anticipated.  I provided 18 minutes of non-face-to-face time during this encounter.   Vikki Ports, MD

## 2019-02-01 ENCOUNTER — Ambulatory Visit
Admission: RE | Admit: 2019-02-01 | Discharge: 2019-02-01 | Disposition: A | Discharge status: Home / Self Care | Payer: 59 | Source: Ambulatory Visit | Attending: Obstetrics and Gynecology | Admitting: Obstetrics and Gynecology

## 2019-02-01 ENCOUNTER — Other Ambulatory Visit: Payer: Self-pay

## 2019-02-01 DIAGNOSIS — Z1231 Encounter for screening mammogram for malignant neoplasm of breast: Secondary | ICD-10-CM

## 2019-02-15 ENCOUNTER — Ambulatory Visit: Payer: Self-pay

## 2019-02-15 ENCOUNTER — Ambulatory Visit (INDEPENDENT_AMBULATORY_CARE_PROVIDER_SITE_OTHER): Payer: 59 | Admitting: Orthopedic Surgery

## 2019-02-15 ENCOUNTER — Encounter: Payer: Self-pay | Admitting: Orthopedic Surgery

## 2019-02-15 DIAGNOSIS — M545 Low back pain: Secondary | ICD-10-CM | POA: Diagnosis not present

## 2019-02-15 DIAGNOSIS — M79605 Pain in left leg: Secondary | ICD-10-CM

## 2019-02-15 NOTE — Addendum Note (Signed)
Addended byLaurann Montana on: 02/15/2019 04:59 PM   Modules accepted: Orders
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
## 2019-03-17 ENCOUNTER — Ambulatory Visit
Admission: RE | Admit: 2019-03-17 | Discharge: 2019-03-17 | Disposition: A | Discharge status: Home / Self Care | Payer: 59 | Source: Ambulatory Visit | Attending: Orthopedic Surgery | Admitting: Orthopedic Surgery

## 2019-03-17 ENCOUNTER — Other Ambulatory Visit: Payer: Self-pay

## 2019-03-17 DIAGNOSIS — M79605 Pain in left leg: Secondary | ICD-10-CM

## 2019-03-23 ENCOUNTER — Other Ambulatory Visit: Payer: Self-pay

## 2019-03-23 ENCOUNTER — Ambulatory Visit (INDEPENDENT_AMBULATORY_CARE_PROVIDER_SITE_OTHER): Payer: 59 | Admitting: Orthopedic Surgery

## 2019-03-23 DIAGNOSIS — M545 Low back pain, unspecified: Secondary | ICD-10-CM

## 2019-03-24 ENCOUNTER — Encounter: Payer: Self-pay | Admitting: Orthopedic Surgery

## 2019-03-24 NOTE — Progress Notes (Signed)
Office Visit Note   Patient: Erin Weaver           Date of Birth: 1965/07/18           MRN: [id number] Visit Date: 03/23/2019 Requested by: [name], [address] [place],  [place] [address] PCP: [name], MD  Subjective: Chief Complaint  Patient presents with  . Follow-up    HPI: Erin Weaver is a patient with left leg pain.  She reports left leg numbness as well as some weakness.  Reports pain down the posterior aspect of the thigh radiating into the lower leg.  She has some weakness when running.  Since have seen her she is had an MRI scan which does show a left-sided L3-4 foraminal disc herniation.  Reviewed the scan with her and pointed out to her.  She has had symptoms for 2 years.              ROS: All systems reviewed are negative as they relate to the chief complaint within the history of present illness.  Patient denies  fevers or chills.   Assessment & Plan: Visit Diagnoses:  1. Low back pain, unspecified back pain laterality, unspecified chronicity, unspecified whether sciatica present     Plan: Impression is L3-4 extraforaminal disc affecting the L3 nerve root.  This matches with her symptoms of quad and hip flexor weakness when running.  Also with her numbness.  Plan epidural steroid injections with Dr. Ernestina Patches with follow-up with me as needed.  If she needs surgical intervention that can be arranged as well.  Follow-Up Instructions: No follow-ups on file.   Orders:  Orders Placed This Encounter  Procedures  . Ambulatory referral to Physical Medicine Rehab   No orders of the defined types were placed in this encounter.     Procedures: No procedures performed   Clinical Data: No additional findings.  Objective: Vital Signs: There were no vitals taken for this visit.  Physical Exam:   Constitutional: Patient appears well-developed HEENT:  Head: Normocephalic Eyes:EOM are normal Neck: Normal range of motion Cardiovascular: Normal rate  Pulmonary/chest: Effort normal Neurologic: Patient is alert Skin: Skin is warm Psychiatric: Patient has normal mood and affect    Ortho Exam: Ortho exam demonstrates normal gait alignment with good muscle tone in the legs.  Patient has good hip flexion abduction and adduction strength.  Leg extension strength is 5+ out of 5 on the right and about 5- out of 5 on the left.  She does have paresthesias in the L3-4 distribution on the left but not on the right.  Reflexes symmetric.  This is in the patella and Achilles.  Specialty Comments:  No specialty comments available.  Imaging: No results found.   PMFS History: Patient Active Problem List   Diagnosis Date Noted  . Back pain with right-sided sciatica 06/01/2016  . Elevated LFTs 03/23/2016  . Acute on chronic cholecystitis s/p lap cholecystectomy 03/24/2016 03/23/2016  . Choledocholithiasis 03/23/2016  . Common bile duct (CBD) obstruction s/p ERCP 03/26/2016 03/23/2016   Past Medical History:  Diagnosis Date  . Choledocholithiasis 03/23/2016  . Migraines     Family History  Problem Relation Age of Onset  . Diabetes Mother   . Hypertension Mother   . Heart disease Father 86       MI  . Diabetes Father   . Hypertension Father   . Cancer Father        skin  . Sjogren's syndrome Sister   .  Cancer Sister        skin  . Migraines Sister   . Cancer Paternal Aunt        ovarian cancer  . Depression Sister   . Irritable bowel syndrome Sister   . Migraines Sister   . Migraines Sister   . Migraines Sister     Past Surgical History:  Procedure Laterality Date  . CESAREAN SECTION    . ERCP N/A 03/26/2016   Procedure: ENDOSCOPIC RETROGRADE CHOLANGIOPANCREATOGRAPHY (ERCP);  Surgeon: Teena Irani, MD;  Location: [facility] ENDOSCOPY;  Service: Endoscopy;  Laterality: N/A;  . LAPAROSCOPIC CHOLECYSTECTOMY SINGLE SITE WITH INTRAOPERATIVE CHOLANGIOGRAM N/A 03/24/2016   Procedure: LAPAROSCOPIC CHOLECYSTECTOMY SINGLE SITE WITH INTRAOPERATIVE  CHOLANGIOGRAM;  Surgeon: Michael Boston, MD;  Location: [facility];  Service: General;  Laterality: N/A;   Social History   Occupational History  . Occupation: attorney; currently homemaker  Tobacco Use  . Smoking status: Never Smoker  . Smokeless tobacco: Never Used  Substance and Sexual Activity  . Alcohol use: Yes    Comment: 5 glasses of wine/week  . Drug use: No  . Sexual activity: Yes    Partners: Male    Comment: husband with vasectomy

## 2019-03-28 ENCOUNTER — Encounter: Payer: Self-pay | Admitting: Orthopedic Surgery

## 2019-03-29 ENCOUNTER — Telehealth: Payer: Self-pay | Admitting: Orthopedic Surgery

## 2019-03-29 NOTE — Telephone Encounter (Signed)
Patient called wanting MRI's on a disc. I advised per our xry dept, she need to get from facility where she had done.

## 2019-04-01 ENCOUNTER — Other Ambulatory Visit: Payer: Self-pay

## 2019-04-01 DIAGNOSIS — Z20822 Contact with and (suspected) exposure to covid-19: Secondary | ICD-10-CM

## 2019-04-01 NOTE — Addendum Note (Signed)
Addended by: Luisa Dago A on: 04/01/2019 03:28 PM   Modules accepted: Orders

## 2019-04-04 LAB — NOVEL CORONAVIRUS, NAA: SARS-CoV-2, NAA: NOT DETECTED

## 2019-04-06 ENCOUNTER — Other Ambulatory Visit: Payer: Self-pay

## 2019-04-06 DIAGNOSIS — Z20822 Contact with and (suspected) exposure to covid-19: Secondary | ICD-10-CM

## 2019-04-08 LAB — NOVEL CORONAVIRUS, NAA: SARS-CoV-2, NAA: NOT DETECTED

## 2019-04-10 ENCOUNTER — Other Ambulatory Visit: Payer: Self-pay | Admitting: Cardiology

## 2019-04-10 DIAGNOSIS — Z20822 Contact with and (suspected) exposure to covid-19: Secondary | ICD-10-CM

## 2019-04-12 LAB — NOVEL CORONAVIRUS, NAA: SARS-CoV-2, NAA: NOT DETECTED

## 2019-04-24 ENCOUNTER — Telehealth: Payer: Self-pay | Admitting: Radiology

## 2019-04-24 NOTE — Telephone Encounter (Signed)
error 

## 2019-05-09 ENCOUNTER — Ambulatory Visit: Payer: Self-pay

## 2019-05-09 ENCOUNTER — Encounter: Payer: Self-pay | Admitting: Physical Medicine and Rehabilitation

## 2019-05-09 ENCOUNTER — Ambulatory Visit (INDEPENDENT_AMBULATORY_CARE_PROVIDER_SITE_OTHER): Payer: 59 | Admitting: Physical Medicine and Rehabilitation

## 2019-05-09 ENCOUNTER — Other Ambulatory Visit: Payer: Self-pay

## 2019-05-09 VITALS — BP 112/76 | HR 76

## 2019-05-09 DIAGNOSIS — M5116 Intervertebral disc disorders with radiculopathy, lumbar region: Secondary | ICD-10-CM

## 2019-05-09 DIAGNOSIS — M5416 Radiculopathy, lumbar region: Secondary | ICD-10-CM

## 2019-05-09 MED ORDER — BETAMETHASONE SOD PHOS & ACET 6 (3-3) MG/ML IJ SUSP
6.0000 mg | Freq: Once | INTRAMUSCULAR | Status: AC
  Administered 2019-05-09: 16:00:00 6 mg

## 2019-05-09 NOTE — Progress Notes (Signed)
   Numeric Pain Rating Scale and Functional Assessment Average Pain (8)   In the last MONTH (on 0-10 scale) has pain interfered with the following?  1. General activity like being  able to carry out your everyday physical activities such as walking, climbing stairs, carrying groceries, or moving a chair?  Rating(9)   +Driver, -BT, -Dye Allergies.  

## 2019-05-10 ENCOUNTER — Encounter: Payer: Self-pay | Admitting: Physical Medicine and Rehabilitation

## 2019-05-10 NOTE — Procedures (Signed)
Lumbosacral Transforaminal Epidural Steroid Injection - Sub-Pedicular Approach with Fluoroscopic Guidance  Patient: Erin Weaver      Date of Birth: 11-11-1965 MRN: [id number] PCP: [name], MD      Visit Date: 05/09/2019   Universal Protocol:    Date/Time: 05/09/2019  Consent Given By: the patient  Position: PRONE  Additional Comments: Vital signs were monitored before and after the procedure. Patient was prepped and draped in the usual sterile fashion. The correct patient, procedure, and site was verified.   Injection Procedure Details:  Procedure Site One Meds Administered:  Meds ordered this encounter  Medications  . betamethasone acetate-betamethasone sodium phosphate (CELESTONE) injection 6 mg    Laterality: Left  Location/Site:  L5-S1  Needle size: 22 G  Needle type: Spinal  Needle Placement: Transforaminal  Findings:    -Comments: Excellent flow of contrast along the nerve and into the epidural space.  Initial flow was extraforaminal then repositioning got excellent flow across the nerve root and the epidural space.  Procedure Details: After squaring off the end-plates to get a true AP view, the C-arm was positioned so that an oblique view of the foramen as noted above was visualized. The target area is just inferior to the "nose of the scotty dog" or sub pedicular. The soft tissues overlying this structure were infiltrated with 2-3 ml. of 1% Lidocaine without Epinephrine.  The spinal needle was inserted toward the target using a "trajectory" view along the fluoroscope beam.  Under AP and lateral visualization, the needle was advanced so it did not puncture dura and was located close the 6 O'Clock position of the pedical in AP tracterory. Biplanar projections were used to confirm position. Aspiration was confirmed to be negative for CSF and/or blood. A 1-2 ml. volume of Isovue-250 was injected and flow of contrast was noted at each level. Radiographs were  obtained for documentation purposes.   After attaining the desired flow of contrast documented above, a 0.5 to 1.0 ml test dose of 0.25% Marcaine was injected into each respective transforaminal space.  The patient was observed for 90 seconds post injection.  After no sensory deficits were reported, and normal lower extremity motor function was noted,   the above injectate was administered so that equal amounts of the injectate were placed at each foramen (level) into the transforaminal epidural space.   Additional Comments:  The patient tolerated the procedure well Dressing: 2 x 2 sterile gauze and Band-Aid    Post-procedure details: Patient was observed during the procedure. Post-procedure instructions were reviewed.  Patient left the clinic in stable condition.
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
## 2019-06-16 ENCOUNTER — Ambulatory Visit: Payer: 59 | Attending: Internal Medicine

## 2019-06-16 DIAGNOSIS — Z20822 Contact with and (suspected) exposure to covid-19: Secondary | ICD-10-CM

## 2019-06-17 LAB — NOVEL CORONAVIRUS, NAA: SARS-CoV-2, NAA: NOT DETECTED

## 2019-06-19 ENCOUNTER — Other Ambulatory Visit: Payer: 59

## 2019-06-19 ENCOUNTER — Ambulatory Visit: Payer: 59 | Attending: Internal Medicine

## 2019-06-19 DIAGNOSIS — Z20822 Contact with and (suspected) exposure to covid-19: Secondary | ICD-10-CM

## 2019-06-20 LAB — NOVEL CORONAVIRUS, NAA: SARS-CoV-2, NAA: NOT DETECTED

## 2019-08-09 ENCOUNTER — Ambulatory Visit: Payer: 59 | Attending: Internal Medicine

## 2019-08-09 ENCOUNTER — Other Ambulatory Visit: Payer: Self-pay

## 2019-08-09 ENCOUNTER — Ambulatory Visit: Payer: 59 | Admitting: Physical Medicine and Rehabilitation

## 2019-08-09 ENCOUNTER — Encounter: Payer: Self-pay | Admitting: Physical Medicine and Rehabilitation

## 2019-08-09 VITALS — BP 108/69 | HR 51 | Ht 62.0 in | Wt 110.0 lb

## 2019-08-09 DIAGNOSIS — M7072 Other bursitis of hip, left hip: Secondary | ICD-10-CM | POA: Diagnosis not present

## 2019-08-09 DIAGNOSIS — M5416 Radiculopathy, lumbar region: Secondary | ICD-10-CM | POA: Diagnosis not present

## 2019-08-09 DIAGNOSIS — M5116 Intervertebral disc disorders with radiculopathy, lumbar region: Secondary | ICD-10-CM | POA: Diagnosis not present

## 2019-08-09 DIAGNOSIS — Z20822 Contact with and (suspected) exposure to covid-19: Secondary | ICD-10-CM

## 2019-08-09 NOTE — Progress Notes (Signed)
Pt states an aching pain in the left side of the lower back that radiates into the back of the left thigh. Pt states last injection helped with left lower pain and numbness, but did not help with left thigh pain and back pain. Reaching for items below waist line and sitting for a long time makes pain worse. Moving around and being active helps with pain.   .Numeric Pain Rating Scale and Functional Assessment Average Pain 2 Pain Right Now 0 My pain is intermittent, sharp, stabbing and aching Pain is worse with: sitting and reaching Pain improves with: therapy/exercise   In the last MONTH (on 0-10 scale) has pain interfered with the following?  1. General activity like being  able to carry out your everyday physical activities such as walking, climbing stairs, carrying groceries, or moving a chair?  Rating(7)  2. Relation with others like being able to carry out your usual social activities and roles such as  activities at home, at work and in your community. Rating(7)  3. Enjoyment of life such that you have  been bothered by emotional problems such as feeling anxious, depressed or irritable?  Rating(3)

## 2019-08-10 ENCOUNTER — Ambulatory Visit: Payer: Self-pay

## 2019-08-10 ENCOUNTER — Encounter: Payer: Self-pay | Admitting: Physical Medicine and Rehabilitation

## 2019-08-10 ENCOUNTER — Ambulatory Visit (INDEPENDENT_AMBULATORY_CARE_PROVIDER_SITE_OTHER): Payer: 59 | Admitting: Physical Medicine and Rehabilitation

## 2019-08-10 DIAGNOSIS — M7072 Other bursitis of hip, left hip: Secondary | ICD-10-CM

## 2019-08-10 LAB — NOVEL CORONAVIRUS, NAA: SARS-CoV-2, NAA: NOT DETECTED

## 2019-08-10 LAB — SARS-COV-2, NAA 2 DAY TAT

## 2019-08-10 NOTE — Progress Notes (Signed)
Numeric Pain Rating Scale and Functional Assessment Average Pain 4   In the last MONTH (on 0-10 scale) has pain interfered with the following?  1. General activity like being  able to carry out your everyday physical activities such as walking, climbing stairs, carrying groceries, or moving a chair?  Rating(4)

## 2019-08-11 DIAGNOSIS — M7072 Other bursitis of hip, left hip: Secondary | ICD-10-CM | POA: Diagnosis not present

## 2019-08-11 MED ORDER — BUPIVACAINE HCL 0.25 % IJ SOLN
4.0000 mL | INTRAMUSCULAR | Status: AC | PRN
  Administered 2019-08-11: 4 mL via INTRA_ARTICULAR

## 2019-08-11 MED ORDER — TRIAMCINOLONE ACETONIDE 40 MG/ML IJ SUSP
60.0000 mg | INTRAMUSCULAR | Status: AC | PRN
  Administered 2019-08-11: 60 mg via INTRA_ARTICULAR

## 2019-08-11 MED ORDER — LIDOCAINE HCL 2 % IJ SOLN
4.0000 mL | INTRAMUSCULAR | Status: AC | PRN
  Administered 2019-08-11: 4 mL

## 2019-08-11 NOTE — Progress Notes (Signed)
JULLIET Weaver - 54 y.o. female MRN [id number]  Date of birth: 08-09-1965  Office Visit Note: Visit Date: 08/10/2019 PCP: [name], MD Referred by: [name], MD  Subjective: Chief Complaint  Patient presents with  . Lower Back - Pain   HPI:  Erin Weaver is a 54 y.o. female who comes in today For planned diagnostic left ischial bursa injection fluoroscopic guidance.  Please see our prior notes for the details justification.  ROS Otherwise per HPI.  Assessment & Plan: Visit Diagnoses:  1. Ischial bursitis of left side     Plan: No additional findings.   Meds & Orders: No orders of the defined types were placed in this encounter.   Orders Placed This Encounter  Procedures  . Large Joint Inj  . XR C-ARM NO REPORT    Follow-up: No follow-ups on file.   Procedures: Large Joint Inj (Left ischial bursa) on 08/11/2019 5:28 AM Indications: pain and diagnostic evaluation Details: 22 G 3.5 in needle, posterior approach  Arthrogram: No  Medications: 4 mL lidocaine 2 %; 4 mL bupivacaine 0.25 %; 60 mg triamcinolone acetonide 40 MG/ML Outcome: tolerated well, no immediate complications  Greatest area of pain over the greater trochanter was palpated and marked prior to injection. The patient did seem to have relief after the injection. Procedure, treatment alternatives, risks and benefits explained, specific risks discussed. Consent was given by the patient. Immediately prior to procedure a time out was called to verify the correct patient, procedure, equipment, support staff and site/side marked as required. Patient was prepped and draped in the usual sterile fashion.      No notes on file   Clinical History: MRI LUMBAR SPINE WITHOUT CONTRAST  TECHNIQUE: Multiplanar, multisequence MR imaging of the lumbar spine was performed. No intravenous contrast was administered.  COMPARISON: MRI lumbar spine dated June 09, 2016.  FINDINGS: Segmentation:  Standard.  Alignment: Physiologic.  Vertebrae: No fracture, evidence of discitis, or bone lesion.  Conus medullaris and cauda equina: Conus extends to the L1 level. Conus and cauda equina appear normal.  Paraspinal and other soft tissues: Negative.  Disc levels:  T12-L1: Negative.  L1-L2: Unchanged small right foraminal disc protrusion. No stenosis.  L2-L3: Negative.  L3-L4: New small left extraforaminal disc protrusion contacting the exiting left L3 nerve root. New mild left neuroforaminal stenosis. No spinal canal or right neuroforaminal stenosis.  L4-L5: Slight interval decrease in size of the broad-based central disc protrusion with unchanged annular fissure. Unchanged mild bilateral facet arthropathy. Improved now mild bilateral lateral recess stenosis. No neuroforaminal stenosis.  L5-S1: Negative disc. Unchanged mild bilateral facet arthropathy. No stenosis.  IMPRESSION: 1. New small left extraforaminal disc protrusion at L3-L4 contacting the exiting left L3 nerve root. 2. Slightly decreased size of the broad-based disc protrusion at L4-L5 with improved now mild bilateral lateral recess stenosis.   Electronically Signed By: Titus Dubin M.D. On: 03/17/2019 16:21 --- MRI PELVIS WITHOUT CONTRAST  TECHNIQUE: Multiplanar multisequence MR imaging of the pelvis was performed. No intravenous contrast was administered.  COMPARISON:  CT abdomen pelvis dated March 23, 2016.  FINDINGS: Bones: There is no evidence of acute fracture, dislocation or avascular necrosis. No focal bone lesion. Minimal degenerative subchondral marrow edema along the inferior left sacroiliac joint. The right sacroiliac joint and pubic symphysis are unremarkable.  Articular cartilage and labrum  Articular cartilage: No focal chondral defect or subchondral signal abnormality identified.  Labrum: Grossly intact, although evaluation is limited due to lack of  intra-articular fluid. No paralabral abnormality.  Joint or bursal effusion  Joint effusion: No significant hip joint effusion.  Bursae: No focal periarticular fluid collection.  Muscles and tendons  Muscles and tendons: The visualized gluteus, hamstring and iliopsoas tendons appear normal. No muscle edema or atrophy.  Other findings  Miscellaneous: The sciatic nerves are unremarkable. The visualized internal pelvic contents appear unremarkable.  IMPRESSION: 1. Normal sciatic nerves. No acute abnormality or significant degenerative changes.   Electronically Signed   By: Titus Dubin M.D.   On: 03/17/2019 16:29     Objective:  VS:  HT:    WT:   BMI:     BP:   HR: bpm  TEMP: ( )  RESP:  Physical Exam  Ortho Exam Imaging: XR C-ARM NO REPORT  Result Date: 08/10/2019 Please see Notes tab for imaging impression.

## 2019-09-25 ENCOUNTER — Encounter: Payer: 59 | Admitting: Family Medicine
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
## 2019-09-26 NOTE — Patient Instructions (Addendum)
  HEALTH MAINTENANCE RECOMMENDATIONS:  It is recommended that you get at least 30 minutes of aerobic exercise at least 5 days/week (for weight loss, you may need as much as 60-90 minutes). This can be any activity that gets your heart rate up. This can be divided in 10-15 minute intervals if needed, but try and build up your endurance at least once a week.  Weight bearing exercise is also recommended twice weekly.  Eat a healthy diet with lots of vegetables, fruits and fiber.  "Colorful" foods have a lot of vitamins (ie green vegetables, tomatoes, red peppers, etc).  Limit sweet tea, regular sodas and alcoholic beverages, all of which has a lot of calories and sugar.  Up to 1 alcoholic drink daily may be beneficial for women (unless trying to lose weight, watch sugars).  Drink a lot of water.  Calcium recommendations are 1200-1500 mg daily (1500 mg for postmenopausal women or women without ovaries), and vitamin D 1000 IU daily.  This should be obtained from diet and/or supplements (vitamins), and calcium should not be taken all at once, but in divided doses.  Monthly self breast exams and yearly mammograms for women over the age of 11 is recommended.  Sunscreen of at least SPF 30 should be used on all sun-exposed parts of the skin when outside between the hours of 10 am and 4 pm (not just when at beach or pool, but even with exercise, golf, tennis, and yard work!)  Use a sunscreen that says "broad spectrum" so it covers both UVA and UVB rays, and make sure to reapply every 1-2 hours.  Remember to change the batteries in your smoke detectors when changing your clock times in the spring and fall. Carbon monoxide detectors are recommended for your home.  Use your seat belt every time you are in a car, and please drive safely and not be distracted with cell phones and texting while driving.  Before coughing, try and feel your pulse to see if you notice any extra beats or pauses.  Schedule a routine eye  exam. Keep a headache log, to help determine triggers, as well as document severity and frequency. We are referring you to see if you are a candidate for any of the Pharmquest migraine studies.  Multivitamin and Magnesium might not be bad ideas to take daily (vs a B complex instead of multi), to help with migraines.  You can talk to you sisters.  Stay well hydrated--urine should be very pale/clear

## 2019-09-28 ENCOUNTER — Ambulatory Visit: Payer: 59 | Admitting: Family Medicine

## 2019-09-28 ENCOUNTER — Encounter: Payer: Self-pay | Admitting: Family Medicine

## 2019-09-28 ENCOUNTER — Other Ambulatory Visit: Payer: Self-pay

## 2019-09-28 VITALS — BP 100/60 | HR 52 | Temp 97.7°F | Ht 62.0 in | Wt 110.2 lb

## 2019-09-28 DIAGNOSIS — R200 Anesthesia of skin: Secondary | ICD-10-CM | POA: Diagnosis not present

## 2019-09-28 DIAGNOSIS — G43909 Migraine, unspecified, not intractable, without status migrainosus: Secondary | ICD-10-CM | POA: Diagnosis not present

## 2019-09-28 DIAGNOSIS — R002 Palpitations: Secondary | ICD-10-CM

## 2019-09-28 DIAGNOSIS — Z0000 Encounter for general adult medical examination without abnormal findings: Secondary | ICD-10-CM

## 2019-09-28 LAB — POCT URINALYSIS DIP (PROADVANTAGE DEVICE)
Bilirubin, UA: NEGATIVE
Blood, UA: NEGATIVE
Glucose, UA: NEGATIVE mg/dL
Ketones, POC UA: NEGATIVE mg/dL
Leukocytes, UA: NEGATIVE
Nitrite, UA: NEGATIVE
Protein Ur, POC: NEGATIVE mg/dL
Specific Gravity, Urine: 1.015
Urobilinogen, Ur: NEGATIVE
pH, UA: 6 (ref 5.0–8.0)

## 2019-09-29 LAB — COMPREHENSIVE METABOLIC PANEL
ALT: 18 IU/L (ref 0–32)
AST: 24 IU/L (ref 0–40)
Albumin/Globulin Ratio: 1.8 (ref 1.2–2.2)
Albumin: 4.4 g/dL (ref 3.8–4.9)
Alkaline Phosphatase: 75 IU/L (ref 39–117)
BUN/Creatinine Ratio: 22 (ref 9–23)
BUN: 17 mg/dL (ref 6–24)
Bilirubin Total: 0.3 mg/dL (ref 0.0–1.2)
CO2: 26 mmol/L (ref 20–29)
Calcium: 9.6 mg/dL (ref 8.7–10.2)
Chloride: 105 mmol/L (ref 96–106)
Creatinine, Ser: 0.77 mg/dL (ref 0.57–1.00)
GFR calc Af Amer: 103 mL/min/{1.73_m2} (ref >59)
GFR calc non Af Amer: 90 mL/min/{1.73_m2} (ref >59)
Globulin, Total: 2.4 g/dL (ref 1.5–4.5)
Glucose: 92 mg/dL (ref 65–99)
Potassium: 4.8 mmol/L (ref 3.5–5.2)
Sodium: 140 mmol/L (ref 134–144)
Total Protein: 6.8 g/dL (ref 6.0–8.5)

## 2019-09-29 LAB — CBC WITH DIFFERENTIAL/PLATELET
Basophils Absolute: 0.1 10*3/uL (ref 0.0–0.2)
Basos: 1 %
EOS (ABSOLUTE): 0.1 10*3/uL (ref 0.0–0.4)
Eos: 1 %
Hematocrit: 40.3 % (ref 34.0–46.6)
Hemoglobin: 13.7 g/dL (ref 11.1–15.9)
Immature Grans (Abs): 0 10*3/uL (ref 0.0–0.1)
Immature Granulocytes: 0 %
Lymphocytes Absolute: 1.4 10*3/uL (ref 0.7–3.1)
Lymphs: 32 %
MCH: 32.2 pg (ref 26.6–33.0)
MCHC: 34 g/dL (ref 31.5–35.7)
MCV: 95 fL (ref 79–97)
Monocytes Absolute: 0.3 10*3/uL (ref 0.1–0.9)
Monocytes: 7 %
Neutrophils Absolute: 2.5 10*3/uL (ref 1.4–7.0)
Neutrophils: 59 %
Platelets: 229 10*3/uL (ref 150–450)
RBC: 4.26 x10E6/uL (ref 3.77–5.28)
RDW: 12.7 % (ref 11.7–15.4)
WBC: 4.2 10*3/uL (ref 3.4–10.8)

## 2019-09-29 LAB — LIPID PANEL
Chol/HDL Ratio: 2.1 ratio (ref 0.0–4.4)
Cholesterol, Total: 222 mg/dL — ABNORMAL HIGH (ref 100–199)
HDL: 107 mg/dL (ref >39)
LDL Chol Calc (NIH): 107 mg/dL — ABNORMAL HIGH (ref 0–99)
Triglycerides: 46 mg/dL (ref 0–149)
VLDL Cholesterol Cal: 8 mg/dL (ref 5–40)

## 2019-09-29 LAB — TSH: TSH: 3.96 u[IU]/mL (ref 0.450–4.500)

## 2019-12-25 ENCOUNTER — Encounter: Payer: Self-pay | Admitting: Family Medicine

## 2020-01-08 ENCOUNTER — Other Ambulatory Visit: Payer: Self-pay | Admitting: Obstetrics and Gynecology

## 2020-01-08 DIAGNOSIS — Z1231 Encounter for screening mammogram for malignant neoplasm of breast: Secondary | ICD-10-CM

## 2020-02-06 ENCOUNTER — Ambulatory Visit: Payer: 59

## 2020-02-19 ENCOUNTER — Encounter: Payer: Self-pay | Admitting: Physical Medicine and Rehabilitation

## 2020-02-19 NOTE — Progress Notes (Signed)
Erin Weaver - 54 y.o. female MRN [id number]  Date of birth: 03-Feb-1966  Office Visit Note: Visit Date: 08/09/2019 PCP: [name], MD Referred by: [name], MD  Subjective: Chief Complaint  Patient presents with  . Lower Back - Pain  . Left Thigh - Pain   HPI: Erin Weaver is a 54 y.o. female who comes in today For evaluation management of chronic worsening severe low back and left hip and buttock pain and posterior hamstring pain.  Patient is status post L5 transforaminal epidural steroid injection back in December.  She reports that did help to some degree it seemed to help with more than numbness but not the posterior hamstring pain.  MRI of the lumbar spine once again reviewed.  Patient very active with home exercise and physical therapy.  Her pain really is radiating to the posterior thigh really over the hamstring time and or ischial bursa.  She reports reaching down and sitting for long time makes the pain worse.  Again no right-sided complaints no hip or groin pain.  No paresthesias at this point.  Average pain a 2 out of 10 but she is very active and it does really limit what she can do though.  Is an intermittent sharp stabbing and aching pain worse with sitting and reaching better with therapy and exercise and medication.  Review of Systems  Musculoskeletal: Positive for back pain.  All other systems reviewed and are negative.  Otherwise per HPI.  Assessment & Plan: Visit Diagnoses:  1. Ischial bursitis of left side   2. Lumbar radiculopathy   3. Radiculopathy due to lumbar intervertebral disc disorder     Plan: Findings:  Chronic worsening severe left buttock and upper hamstring pain.  Some relief with prior epidural injection at L5.  Has broad-based disc protrusion at L4-5.  Pain is worse with sitting and reaching.  Still could be discogenic type nerve root pain but nothing paresthesias.  At this point I feel like this could be an ischial bursitis with pain over the  ischial bursa on exam.  We will bring her back for ischial bursa injection diagnostically.  If not successful will look at repeat epidural and regrouping with physical therapy.    Meds & Orders: No orders of the defined types were placed in this encounter.  No orders of the defined types were placed in this encounter.   Follow-up: No follow-ups on file.   Procedures: No procedures performed  No notes on file   Clinical History: MRI LUMBAR SPINE WITHOUT CONTRAST  TECHNIQUE: Multiplanar, multisequence MR imaging of the lumbar spine was performed. No intravenous contrast was administered.  COMPARISON: MRI lumbar spine dated June 09, 2016.  FINDINGS: Segmentation: Standard.  Alignment: Physiologic.  Vertebrae: No fracture, evidence of discitis, or bone lesion.  Conus medullaris and cauda equina: Conus extends to the L1 level. Conus and cauda equina appear normal.  Paraspinal and other soft tissues: Negative.  Disc levels:  T12-L1: Negative.  L1-L2: Unchanged small right foraminal disc protrusion. No stenosis.  L2-L3: Negative.  L3-L4: New small left extraforaminal disc protrusion contacting the exiting left L3 nerve root. New mild left neuroforaminal stenosis. No spinal canal or right neuroforaminal stenosis.  L4-L5: Slight interval decrease in size of the broad-based central disc protrusion with unchanged annular fissure. Unchanged mild bilateral facet arthropathy. Improved now mild bilateral lateral recess stenosis. No neuroforaminal stenosis.  L5-S1: Negative disc. Unchanged mild bilateral facet arthropathy. No stenosis.  IMPRESSION: 1. New small  left extraforaminal disc protrusion at L3-L4 contacting the exiting left L3 nerve root. 2. Slightly decreased size of the broad-based disc protrusion at L4-L5 with improved now mild bilateral lateral recess stenosis.   Electronically Signed By: Titus Dubin M.D. On: 03/17/2019  16:21 --- MRI PELVIS WITHOUT CONTRAST  TECHNIQUE: Multiplanar multisequence MR imaging of the pelvis was performed. No intravenous contrast was administered.  COMPARISON:  CT abdomen pelvis dated March 23, 2016.  FINDINGS: Bones: There is no evidence of acute fracture, dislocation or avascular necrosis. No focal bone lesion. Minimal degenerative subchondral marrow edema along the inferior left sacroiliac joint. The right sacroiliac joint and pubic symphysis are unremarkable.  Articular cartilage and labrum  Articular cartilage: No focal chondral defect or subchondral signal abnormality identified.  Labrum: Grossly intact, although evaluation is limited due to lack of intra-articular fluid. No paralabral abnormality.  Joint or bursal effusion  Joint effusion: No significant hip joint effusion.  Bursae: No focal periarticular fluid collection.  Muscles and tendons  Muscles and tendons: The visualized gluteus, hamstring and iliopsoas tendons appear normal. No muscle edema or atrophy.  Other findings  Miscellaneous: The sciatic nerves are unremarkable. The visualized internal pelvic contents appear unremarkable.  IMPRESSION: 1. Normal sciatic nerves. No acute abnormality or significant degenerative changes.   Electronically Signed   By: Titus Dubin M.D.   On: 03/17/2019 16:29   She reports that she has never smoked. She has never used smokeless tobacco. No results for input(s): HGBA1C, LABURIC in the last 8760 hours.  Objective:  VS:  HT:5\' 2"  (157.5 cm)   WT:110 lb (49.9 kg)  BMI:20.11    BP:108/69  HR:(!) 51bpm  TEMP: ( )  RESP:  Physical Exam Constitutional:      General: She is not in acute distress.    Appearance: Normal appearance. She is not ill-appearing.  HENT:     Head: Normocephalic and atraumatic.     Right Ear: External ear normal.     Left Ear: External ear normal.  Eyes:     Extraocular Movements: Extraocular  movements intact.  Cardiovascular:     Rate and Rhythm: Normal rate.     Pulses: Normal pulses.  Musculoskeletal:     Right lower leg: No edema.     Left lower leg: No edema.     Comments: Patient has good distal strength with no pain over the greater trochanters.  No clonus or focal weakness.  She does have concordant pain over the left ischial bursa.  She has an equivocally positive slump test on the left.  Skin:    Findings: No erythema, lesion or rash.  Neurological:     General: No focal deficit present.     Mental Status: She is alert and oriented to person, place, and time.     Sensory: No sensory deficit.     Motor: No weakness or abnormal muscle tone.     Coordination: Coordination normal.  Psychiatric:        Mood and Affect: Mood normal.        Behavior: Behavior normal.     Ortho Exam  Imaging: No results found.  Past Medical/Family/Surgical/Social History: Medications & Allergies reviewed per EMR, new medications updated. Patient Active Problem List   Diagnosis Date Noted  . Back pain with right-sided sciatica 06/01/2016  . Elevated LFTs 03/23/2016  . Acute on chronic cholecystitis s/p lap cholecystectomy 03/24/2016 03/23/2016  . Choledocholithiasis 03/23/2016  . Common bile duct (CBD) obstruction s/p ERCP 03/26/2016  03/23/2016   Past Medical History:  Diagnosis Date  . Choledocholithiasis 03/23/2016  . Migraines    Family History  Problem Relation Age of Onset  . Diabetes Mother   . Hypertension Mother   . Heart disease Father 14       MI  . Diabetes Father   . Hypertension Father   . Cancer Father        skin  . Sjogren's syndrome Sister   . Cancer Sister        skin  . Migraines Sister   . Cancer Paternal Aunt        ovarian cancer  . Migraines Brother   . Depression Sister   . Irritable bowel syndrome Sister   . Migraines Sister   . Migraines Sister   . Migraines Sister   . Migraines Sister    Past Surgical History:  Procedure  Laterality Date  . CESAREAN SECTION    . ERCP N/A 03/26/2016   Procedure: ENDOSCOPIC RETROGRADE CHOLANGIOPANCREATOGRAPHY (ERCP);  Surgeon: Teena Irani, MD;  Location[facility] ENDOSCOPY;  Service: Endoscopy;  Laterality: N/A;  . LAPAROSCOPIC CHOLECYSTECTOMY SINGLE SITE WITH INTRAOPERATIVE CHOLANGIOGRAM N/A 03/24/2016   Procedure: LAPAROSCOPIC CHOLECYSTECTOMY SINGLE SITE WITH INTRAOPERATIVE CHOLANGIOGRAM;  Surgeon: Michael Boston, MD;  Location: [place];  Service: General;  Laterality: N/A;   Social History   Occupational History  . Occupation: attorney; currently homemaker  Tobacco Use  . Smoking status: Never Smoker  . Smokeless tobacco: Never Used  Vaping Use  . Vaping Use: Never used  Substance and Sexual Activity  . Alcohol use: Yes    Comment: 4-5 glasses of wine/week (usually 1 at a time, occasionally has 2)  . Drug use: No  . Sexual activity: Yes    Partners: Male    Comment: husband with vasectomy

## 2020-02-23 ENCOUNTER — Other Ambulatory Visit: Payer: Self-pay

## 2020-02-23 DIAGNOSIS — Z20822 Contact with and (suspected) exposure to covid-19: Secondary | ICD-10-CM

## 2020-02-24 LAB — NOVEL CORONAVIRUS, NAA: SARS-CoV-2, NAA: NOT DETECTED

## 2020-02-24 LAB — SARS-COV-2, NAA 2 DAY TAT

## 2020-02-27 ENCOUNTER — Ambulatory Visit: Payer: Self-pay

## 2020-03-06 ENCOUNTER — Ambulatory Visit
Admission: RE | Admit: 2020-03-06 | Discharge: 2020-03-06 | Disposition: A | Discharge status: Home / Self Care | Payer: Self-pay | Source: Ambulatory Visit | Attending: Obstetrics and Gynecology | Admitting: Obstetrics and Gynecology

## 2020-03-06 ENCOUNTER — Other Ambulatory Visit: Payer: Self-pay

## 2020-03-06 ENCOUNTER — Telehealth (INDEPENDENT_AMBULATORY_CARE_PROVIDER_SITE_OTHER): Payer: BC Managed Care – PPO | Admitting: Family Medicine

## 2020-03-06 ENCOUNTER — Encounter: Payer: Self-pay | Admitting: Family Medicine

## 2020-03-06 VITALS — Temp 97.6°F | Ht 62.0 in | Wt 107.0 lb

## 2020-03-06 DIAGNOSIS — B349 Viral infection, unspecified: Secondary | ICD-10-CM

## 2020-03-06 DIAGNOSIS — J029 Acute pharyngitis, unspecified: Secondary | ICD-10-CM | POA: Diagnosis not present

## 2020-03-06 DIAGNOSIS — Z1231 Encounter for screening mammogram for malignant neoplasm of breast: Secondary | ICD-10-CM

## 2020-03-06 NOTE — Patient Instructions (Signed)
Drink plenty of water. Salt water gargles, warm tea or honey, and/or continue tylenol or ibuprofen as needed for the sore throat. Resume taking sudafed and mucinex (plain or DM, DM only if coughing a lot). You may also want to take allergy medications such as claritin, zyrtec or allegra, since that may be a contributing factor to the drainage. If you start feeling worse again--worsening cough, fever, fatigue, etc contact me for antibiotics.  At this point it sounds like you've been improving (other than the throat).

## 2020-03-06 NOTE — Progress Notes (Signed)
Start time: 11:10 End time: 11:32  Virtual Visit via Video Note  I connected with Erin Weaver on 03/06/20 by a video enabled telemedicine application and verified that I am speaking with the correct person using two identifiers.  Location:  Patient: home Provider: office   I discussed the limitations of evaluation and management by telemedicine and the availability of in person appointments. The patient expressed understanding and agreed to proceed.  History of Present Illness:  Chief Complaint  Patient presents with  . Cough    VIRTUAL cough and ST x 12 days. Not concerned about the congestion and cold, more concerned about the ST. No fever. Had COVID test -neg.    Started feeling bad on 10/7. Her son had come home for Fall Break, had been sick. He had negative COVID test prior to coming home and upon return.  She got tested when she first developed symptoms of sore throat and laryngitis, on 10/7, and COVID test was negative. She also had some chest pressure at that time, but otherwise felt okay. Last week (10/11-13) she felt much worse--very fatigued, couldn't get out of bed, felt achey. The afternoon of 10/13 her energy improved.  She was able to run some yesterday (whereas last week when she tried, she struggled and was worn out).  Her sore throat hasn't resolved, which concerns her the most. Throat has been very painful, using a lot of lozenges, and tylenol or ibuprofen, which does help with the pain. The pain goes back and forth between sides (not consistently on one side).   She denies any nasal drainage, sinus pain resolved (had that last week). Cough is nonproductive (last week she got up yellow-green, resolved). Her voice has improved, but still intermittently somewhat hoarse.  Currently cough is nonproductive.  No chest pain/pressure. No sinus headaches or migraines.  She had taken sudafed and mucinex, stopped it 48 hours ago, wasn't sure how much it was helping.  She  continues to use Nyquil to help her sleep. Coughs some at night, worse laying down.  PMH, PSH, SH reviewed  Outpatient Encounter Medications as of 03/06/2020  Medication Sig Note  . DM-Doxylamine-Acetaminophen 30-12.09-998 MG/30ML LIQD Take 30 mLs by mouth at bedtime.   . pseudoephedrine (SUDAFED) 30 MG tablet Take 30 mg by mouth every 4 (four) hours as needed for congestion.   Marland Kitchen acetaminophen (TYLENOL) 325 MG tablet Take 650 mg by mouth every 6 (six) hours as needed. (Patient not taking: Reported on 03/06/2020)   . diphenhydramine-acetaminophen (TYLENOL PM) 25-500 MG TABS tablet Take 1 tablet by mouth at bedtime as needed. (Patient not taking: Reported on 03/06/2020) 02/02/2018: Uses prn with migraines  . Estradiol 10 MCG TABS vaginal tablet Place 1 tablet vaginally 2 (two) times a week. (Patient not taking: Reported on 03/06/2020)   . guaiFENesin (MUCINEX) 600 MG 12 hr tablet Take 600 mg by mouth 2 (two) times daily. (Patient not taking: Reported on 03/06/2020) 03/06/2020: Stopped 48 hours ago  . ibuprofen (ADVIL,MOTRIN) 200 MG tablet Take 200 mg by mouth every 6 (six) hours as needed. (Patient not taking: Reported on 03/06/2020)   . meloxicam (MOBIC) 15 MG tablet Take 1 tablet (15 mg total) by mouth daily as needed for pain. (Patient not taking: Reported on 09/28/2019) 09/28/2019: As needed  . naproxen sodium (ALEVE) 220 MG tablet Take 220-440 mg by mouth. (Patient not taking: Reported on 03/06/2020)   . tretinoin (RETIN-A) 0.025 % cream APPLY TO AFFECTED AREAS ON FACE NIGHTLY AT BEDTIME  UTD (Patient not taking: Reported on 03/06/2020) 09/28/2019: As needed   No facility-administered encounter medications on file as of 03/06/2020.   Allergies  Allergen Reactions  . Erythromycin Rash  . Sulfa Antibiotics Rash   ROS: Never had fever, had some chills early last week, which resolved. Achiness resolved. Currently without any sinus pain or chest pressure. No n/v/d. No bleeding/bruising,  rashes.   Observations/Objective:  Temp 97.6 F (36.4 C) (Oral)   Ht 5\' 2"  (1.575 m)   Wt 107 lb (48.5 kg)   BMI 19.57 kg/m  Pleasant, well-appearing female, in good spirits. Her voice is hoarse, which improves after clearing throat. Some throat-clearing and dry cough noted during visit. She is speaking easily, in no distress Normal mood, affect, hygiene and grooming HEENT: conjunctiva and sclera are clear, EOMI. Cranial nerves are grossly intact.  Her daughter Aldona Bar took a Clinical research associate of her throat, and texted it to me-- Mucus membranes appear moist.  There is very mild erythema of the anterior tonsillar pillars bilaterally.  Only the left tonsil is seen in photo, which appears normal. No erythema or exudate.  Posterior OP not well visualized, but visualized portion on either side of uvula appear normal.  Assessment and Plan:  Pharyngitis with viral syndrome - feeling better overall, other than ST. Supportive measures reviewed.  S/sx bacterial infection reviewed, contact us if not resolving/worse  Ddx discussed--clearly had viral syndrome last week, and overall improving. Cannot rule out allergies contributing to drainage and sore throat. Continue supportive measures, which were outline.   Drink plenty of water. Salt water gargles, warm tea or honey, and/or continue tylenol or ibuprofen as needed for the sore throat. Resume taking sudafed and mucinex (plain or DM, DM only if coughing a lot). You may also want to take allergy medications such as claritin, zyrtec or allegra, since that may be a contributing factor to the drainage. If you start feeling worse again--worsening cough, fever, fatigue, etc contact me for antibiotics.  At this point it sounds like you've been improving (other than the throat).   Follow Up Instructions:    I discussed the assessment and treatment plan with the patient. The patient was provided an opportunity to ask questions and all were answered. The  patient agreed with the plan and demonstrated an understanding of the instructions.   The patient was advised to call back or seek an in-person evaluation if the symptoms worsen or if the condition fails to improve as anticipated.  I provided 22 minutes of video face-to-face time during this encounter.  Additional time was spent in chart review and documentation.   Vikki Ports, MD

## 2020-04-23 ENCOUNTER — Telehealth: Payer: Self-pay | Admitting: Physical Medicine and Rehabilitation

## 2020-04-23 NOTE — Telephone Encounter (Signed)
Last injection was 08/10/19.

## 2020-04-23 NOTE — Telephone Encounter (Signed)
Patent called needing an appointment with Dr Ernestina Patches for left ischial bursa pain. The number to contact patient is [phone]

## 2020-04-24 NOTE — Telephone Encounter (Signed)
Scheduled for 1/3 at 1400.

## 2020-04-24 NOTE — Telephone Encounter (Signed)
Ok if same

## 2020-05-20 ENCOUNTER — Ambulatory Visit (INDEPENDENT_AMBULATORY_CARE_PROVIDER_SITE_OTHER): Payer: 59 | Admitting: Physical Medicine and Rehabilitation

## 2020-05-20 ENCOUNTER — Other Ambulatory Visit: Payer: 59

## 2020-05-20 ENCOUNTER — Other Ambulatory Visit: Payer: Self-pay

## 2020-05-20 ENCOUNTER — Ambulatory Visit: Payer: Self-pay

## 2020-05-20 DIAGNOSIS — M7072 Other bursitis of hip, left hip: Secondary | ICD-10-CM | POA: Diagnosis not present

## 2020-05-20 DIAGNOSIS — M25552 Pain in left hip: Secondary | ICD-10-CM

## 2020-05-20 DIAGNOSIS — Z20822 Contact with and (suspected) exposure to covid-19: Secondary | ICD-10-CM

## 2020-05-20 DIAGNOSIS — M76899 Other specified enthesopathies of unspecified lower limb, excluding foot: Secondary | ICD-10-CM

## 2020-05-20 NOTE — Progress Notes (Signed)
Erin Weaver - 55 y.o. female MRN [id number]  Date of birth: [birth year]  Office Visit Note: Visit Date: 05/20/2020 PCP: [name], MD Referred by: [name], MD  Subjective: Chief Complaint  Patient presents with  . Left Hip - Pain   HPI:  Erin Weaver is a 55 y.o. female who comes in today For evaluation and management of chronic worsening severe left buttock and hamstring pain. Her history is that she has been followed by her primary care physician Dr. [name] as well as sports medicine physicians Dr. Lilia Argue and Dr. Eunice Blase was in our office.  She is also been seen at least on one occasion by Dr. Anderson Malta.  After failure of care with physical therapy and medication management MRI of the lumbar spine was performed and we ended up completing an L5 transforaminal epidural steroid injection that was only somewhat beneficial.  The last time I saw her was in March and we completed ischial bursa injection which gave her a great amount of relief.  She says it lasted really from March till November and she was quite happy.  She does notice running with heels seems to really exacerbate the problem.  If she runs on flat ground she does not seem to really get this flared up at all.  She reports it is very hard to run on flat ground in [place] as there is a lot of hills.  She had a period where she was in [place] and was able to run without any problem.  She has no right-sided complaints.  No focal weakness no red flag complaints no trauma.  She has had physical therapy but I think this was prior to really good diagnostic injection of the ischial bursa.  Review of Systems  Musculoskeletal: Positive for joint pain.  All other systems reviewed and are negative.  Otherwise per HPI.  Assessment & Plan: Visit Diagnoses:    ICD-10-CM   1. Ischial bursitis of left side  M70.72 XR C-ARM NO REPORT    Large Joint Inj  2. Hamstring tendonitis  M76.899   3. 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  No real relief substantially from lumbar spine procedures.  At this point we are going to repeat the ischial bursa injection today.  We talked about activity modification and running and strengthening.  I think the best approach to [place] to have her see Dr. Legrand Como hilts again for potentially looking at [facility] or prolotherapy which I think he may have looked at before.  Conversely regrouping with physical therapy for more eccentric type work for this hamstring tendinopathy.  Intermittent cortisone injections can be done.  We have asked her to lay off any ballistic type movements after the injection for a week.  She can run some and just needs to pay attention to her symptoms.    Meds & Orders: No orders of the defined types were placed in this encounter.   Orders Placed This Encounter  Procedures  . Large Joint Inj  . XR C-ARM NO REPORT    Follow-up: Return if symptoms worsen or fail to improve, for Consider Dr. Junius Roads for evaluation for Prolo/PRP and running eval.   Procedures: Large Joint Inj (Left Ischial Bursa) on 05/20/2020 2:31 PM Indications: diagnostic evaluation and pain Details: 22 G 3.5 in needle, fluoroscopy-guided  anterior approach  Arthrogram: No  Medications: 4 mL bupivacaine 0.25 %; 60 mg triamcinolone acetonide 40 MG/ML Outcome: tolerated well, no immediate complications  There was excellent flow of contrast producing a partial arthrogram of the hip. The patient did have relief of symptoms during the anesthetic phase of the injection. Procedure, treatment alternatives, risks and benefits explained, specific risks discussed. Consent was given by the patient. Immediately prior to procedure a time out was called to verify the correct patient, procedure, equipment, support staff and site/side marked as  required. Patient was prepped and draped in the usual sterile fashion.          Clinical History: MRI LUMBAR SPINE WITHOUT CONTRAST  TECHNIQUE: Multiplanar, multisequence MR imaging of the lumbar spine was performed. No intravenous contrast was administered.  COMPARISON: MRI lumbar spine dated June 09, 2016.  FINDINGS: Segmentation: Standard.  Alignment: Physiologic.  Vertebrae: No fracture, evidence of discitis, or bone lesion.  Conus medullaris and cauda equina: Conus extends to the L1 level. Conus and cauda equina appear normal.  Paraspinal and other soft tissues: Negative.  Disc levels:  T12-L1: Negative.  L1-L2: Unchanged small right foraminal disc protrusion. No stenosis.  L2-L3: Negative.  L3-L4: New small left extraforaminal disc protrusion contacting the exiting left L3 nerve root. New mild left neuroforaminal stenosis. No spinal canal or right neuroforaminal stenosis.  L4-L5: Slight interval decrease in size of the broad-based central disc protrusion with unchanged annular fissure. Unchanged mild bilateral facet arthropathy. Improved now mild bilateral lateral recess stenosis. No neuroforaminal stenosis.  L5-S1: Negative disc. Unchanged mild bilateral facet arthropathy. No stenosis.  IMPRESSION: 1. New small left extraforaminal disc protrusion at L3-L4 contacting the exiting left L3 nerve root. 2. Slightly decreased size of the broad-based disc protrusion at L4-L5 with improved now mild bilateral lateral recess stenosis.   Electronically Signed By: Titus Dubin M.D. On: 03/17/2019 16:21 --- MRI PELVIS WITHOUT CONTRAST  TECHNIQUE: Multiplanar multisequence MR imaging of the pelvis was performed. No intravenous contrast was administered.  COMPARISON:  CT abdomen pelvis dated March 23, 2016.  FINDINGS: Bones: There is no evidence of acute fracture, dislocation or avascular necrosis. No focal bone lesion. Minimal  degenerative subchondral marrow edema along the inferior left sacroiliac joint. The right sacroiliac joint and pubic symphysis are unremarkable.  Articular cartilage and labrum  Articular cartilage: No focal chondral defect or subchondral signal abnormality identified.  Labrum: Grossly intact, although evaluation is limited due to lack of intra-articular fluid. No paralabral abnormality.  Joint or bursal effusion  Joint effusion: No significant hip joint effusion.  Bursae: No focal periarticular fluid collection.  Muscles and tendons  Muscles and tendons: The visualized gluteus, hamstring and iliopsoas tendons appear normal. No muscle edema or atrophy.  Other findings  Miscellaneous: The sciatic nerves are unremarkable. The visualized internal pelvic contents appear unremarkable.  IMPRESSION: 1. Normal sciatic nerves. No acute abnormality or significant degenerative changes.   Electronically Signed   By: Titus Dubin M.D.   On: 03/17/2019 16:29     Objective:  VS:  HT:    WT:   BMI:     BP:   HR: bpm  TEMP: ( )  RESP:  Physical Exam Vitals and nursing note reviewed.  Constitutional:      General: She is not in acute distress.    Appearance: Normal appearance. She is not ill-appearing.  HENT:     Head: Normocephalic and atraumatic.     Right Ear: External ear normal.  Left Ear: External ear normal.  Eyes:     Extraocular Movements: Extraocular movements intact.  Cardiovascular:     Rate and Rhythm: Normal rate.     Pulses: Normal pulses.  Pulmonary:     Effort: Pulmonary effort is normal. No respiratory distress.  Abdominal:     General: There is no distension.     Palpations: Abdomen is soft.  Musculoskeletal:        General: Tenderness present.     Cervical back: Neck supple.     Right lower leg: No edema.     Left lower leg: No edema.     Comments: Patient has good distal strength with no pain over the greater trochanters.  No  clonus or focal weakness.  Concordant pain with palpation over the left ischial bursa hamstring proximal tendon.  Skin:    Findings: No erythema, lesion or rash.  Neurological:     General: No focal deficit present.     Mental Status: She is alert and oriented to person, place, and time.     Sensory: No sensory deficit.     Motor: No weakness or abnormal muscle tone.     Coordination: Coordination normal.  Psychiatric:        Mood and Affect: Mood normal.        Behavior: Behavior normal.      Imaging: No results found.

## 2020-05-20 NOTE — Progress Notes (Signed)
Pt state left buttocks area. Pt states when she is sitting down the pain gets worse.  Pt has hx of inj on 08/10/19 pt state it worked and lasted till November.  Numeric Pain Rating Scale and Functional Assessment Average Pain 1   In the last MONTH (on 0-10 scale) has pain interfered with the following?  1. General activity like being  able to carry out your everyday physical activities such as walking, climbing stairs, carrying groceries, or moving a chair?  Rating(7)

## 2020-05-21 LAB — SARS-COV-2, NAA 2 DAY TAT

## 2020-05-21 LAB — NOVEL CORONAVIRUS, NAA: SARS-CoV-2, NAA: NOT DETECTED

## 2020-06-25 ENCOUNTER — Encounter: Payer: Self-pay | Admitting: Family Medicine

## 2020-06-25 DIAGNOSIS — M5416 Radiculopathy, lumbar region: Secondary | ICD-10-CM

## 2020-06-26 MED ORDER — MELOXICAM 15 MG PO TABS: 15.0000 mg | ORAL_TABLET | Freq: Every day | ORAL | 0 refills | Status: DC | PRN

## 2020-07-02 ENCOUNTER — Encounter: Payer: Self-pay | Admitting: Physical Medicine and Rehabilitation

## 2020-07-02 LAB — HM DEXA SCAN

## 2020-07-02 MED ORDER — TRIAMCINOLONE ACETONIDE 40 MG/ML IJ SUSP
60.0000 mg | INTRAMUSCULAR | Status: AC | PRN
  Administered 2020-05-20: 60 mg via INTRA_ARTICULAR

## 2020-07-02 MED ORDER — BUPIVACAINE HCL 0.25 % IJ SOLN
4.0000 mL | INTRAMUSCULAR | Status: AC | PRN
  Administered 2020-05-20: 4 mL via INTRA_ARTICULAR

## 2020-07-11 ENCOUNTER — Encounter: Payer: Self-pay | Admitting: Family Medicine

## 2020-10-18 ENCOUNTER — Telehealth: Payer: Self-pay | Admitting: Family Medicine

## 2020-10-18 MED ORDER — METHYLPREDNISOLONE 4 MG PO TBPK: ORAL_TABLET | ORAL | 0 refills | Status: DC

## 2020-10-18 NOTE — Telephone Encounter (Signed)
Patient has been in [place] x 3 weeks.  Shortly after her arrival in [place], required ER visit for back/leg pain that had woken her up at night.  She has been taking NSAIDs, Tylenol, and still having severe pain in the LLE.  It feels raw to the touch at medial aspect of knee, and pain is down the front of the thigh.  Not having too much pain in the back itself, though it twinges and causes more leg pain with certain movements.  She has an appointment with Dr. Sherley Bounds for Tuesday of next week.  Suspect lumbar radiculopathy.  D/w pt that she will need another MRI, likely will be ordered by neurosurgeon next week.  We discussed steroid taper, including risks and side effects.  Also briefly discussed gabapentin, as it sounds like she is having a lot of nerve pain.  Will see how she does with the steroids, and what the neurosurgeon suggests next week.  Medrol dosepak sent to her pharmacy

## 2021-01-07 ENCOUNTER — Encounter: Payer: Self-pay | Admitting: Family Medicine

## 2021-01-07 DIAGNOSIS — R42 Dizziness and giddiness: Secondary | ICD-10-CM

## 2021-01-08 ENCOUNTER — Ambulatory Visit: Payer: BC Managed Care – PPO | Admitting: Family Medicine

## 2021-01-21 ENCOUNTER — Other Ambulatory Visit: Payer: Self-pay | Admitting: Obstetrics and Gynecology

## 2021-01-21 DIAGNOSIS — Z1231 Encounter for screening mammogram for malignant neoplasm of breast: Secondary | ICD-10-CM

## 2021-02-27 ENCOUNTER — Other Ambulatory Visit: Payer: Self-pay

## 2021-02-27 ENCOUNTER — Ambulatory Visit (INDEPENDENT_AMBULATORY_CARE_PROVIDER_SITE_OTHER): Payer: 59

## 2021-02-27 DIAGNOSIS — Z23 Encounter for immunization: Secondary | ICD-10-CM

## 2021-03-12 ENCOUNTER — Ambulatory Visit
Admission: RE | Admit: 2021-03-12 | Discharge: 2021-03-12 | Disposition: A | Discharge status: Home / Self Care | Payer: 59 | Source: Ambulatory Visit | Attending: Obstetrics and Gynecology | Admitting: Obstetrics and Gynecology

## 2021-03-12 ENCOUNTER — Other Ambulatory Visit: Payer: Self-pay

## 2021-03-12 DIAGNOSIS — Z1231 Encounter for screening mammogram for malignant neoplasm of breast: Secondary | ICD-10-CM

## 2021-05-22 DIAGNOSIS — M7662 Achilles tendinitis, left leg: Secondary | ICD-10-CM | POA: Diagnosis not present

## 2021-05-22 DIAGNOSIS — M5116 Intervertebral disc disorders with radiculopathy, lumbar region: Secondary | ICD-10-CM | POA: Diagnosis not present

## 2021-05-22 DIAGNOSIS — M9904 Segmental and somatic dysfunction of sacral region: Secondary | ICD-10-CM | POA: Diagnosis not present

## 2021-05-22 DIAGNOSIS — M4726 Other spondylosis with radiculopathy, lumbar region: Secondary | ICD-10-CM | POA: Diagnosis not present

## 2021-05-22 DIAGNOSIS — M7918 Myalgia, other site: Secondary | ICD-10-CM | POA: Diagnosis not present

## 2021-05-22 DIAGNOSIS — M25672 Stiffness of left ankle, not elsewhere classified: Secondary | ICD-10-CM | POA: Diagnosis not present

## 2021-05-22 DIAGNOSIS — M9906 Segmental and somatic dysfunction of lower extremity: Secondary | ICD-10-CM | POA: Diagnosis not present

## 2021-05-22 DIAGNOSIS — M79672 Pain in left foot: Secondary | ICD-10-CM | POA: Diagnosis not present

## 2021-05-22 DIAGNOSIS — M9903 Segmental and somatic dysfunction of lumbar region: Secondary | ICD-10-CM | POA: Diagnosis not present

## 2021-05-22 DIAGNOSIS — M9905 Segmental and somatic dysfunction of pelvic region: Secondary | ICD-10-CM | POA: Diagnosis not present

## 2021-05-22 DIAGNOSIS — M5442 Lumbago with sciatica, left side: Secondary | ICD-10-CM | POA: Diagnosis not present

## 2021-06-05 DIAGNOSIS — Z01419 Encounter for gynecological examination (general) (routine) without abnormal findings: Secondary | ICD-10-CM | POA: Diagnosis not present

## 2021-06-05 DIAGNOSIS — Z6820 Body mass index (BMI) 20.0-20.9, adult: Secondary | ICD-10-CM | POA: Diagnosis not present

## 2021-06-05 DIAGNOSIS — Z124 Encounter for screening for malignant neoplasm of cervix: Secondary | ICD-10-CM | POA: Diagnosis not present

## 2021-06-05 DIAGNOSIS — Z8349 Family history of other endocrine, nutritional and metabolic diseases: Secondary | ICD-10-CM | POA: Diagnosis not present

## 2021-06-06 LAB — HM PAP SMEAR: HM Pap smear: NEGATIVE

## 2021-06-09 ENCOUNTER — Encounter: Payer: Self-pay | Admitting: Family Medicine

## 2021-06-09 ENCOUNTER — Encounter: Payer: Self-pay | Admitting: *Deleted

## 2021-09-13 IMAGING — MG DIGITAL SCREENING BILAT W/ CAD
5 series · 5 of 5 positions shown · non-contrast
Comparison: Previous exam(s).

CLINICAL DATA: Screening.

EXAM:
DIGITAL SCREENING BILATERAL MAMMOGRAM WITH CAD

[L CC]
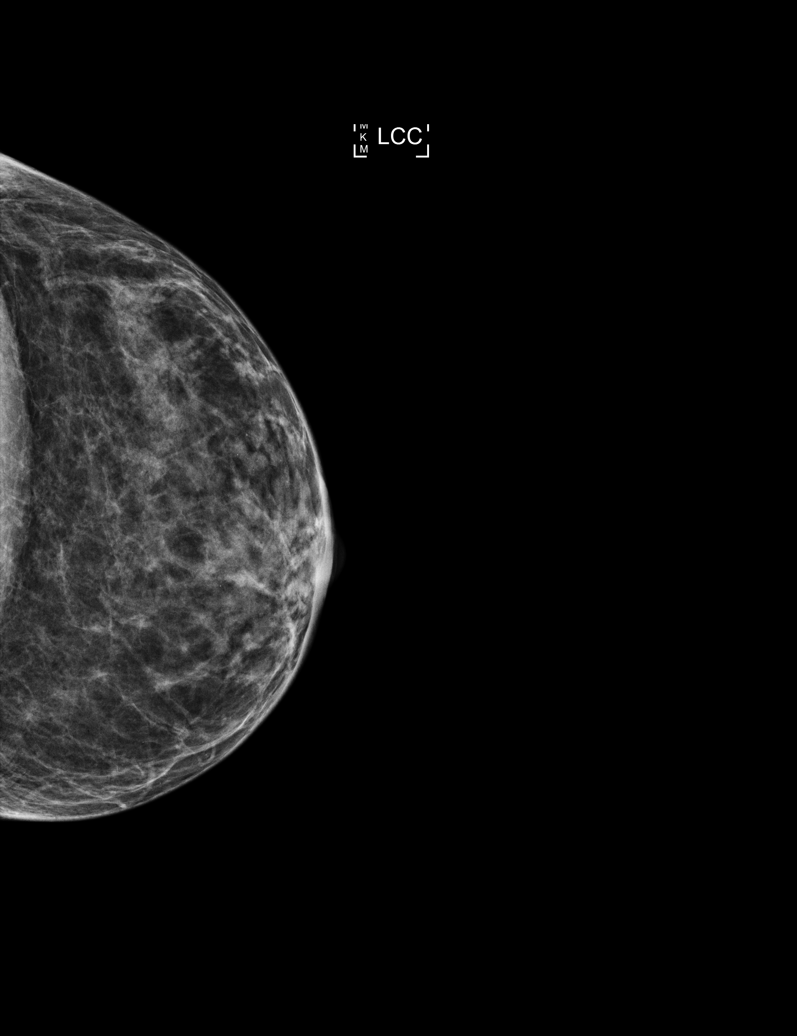

[L MLO]
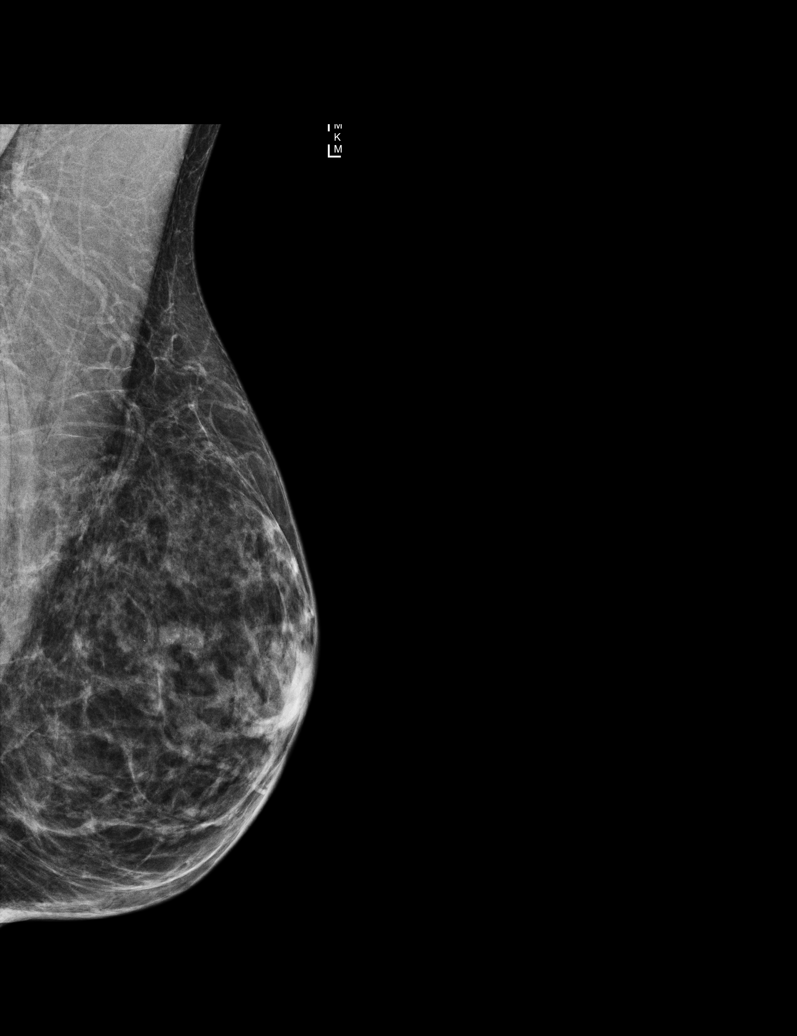

[R MLO]
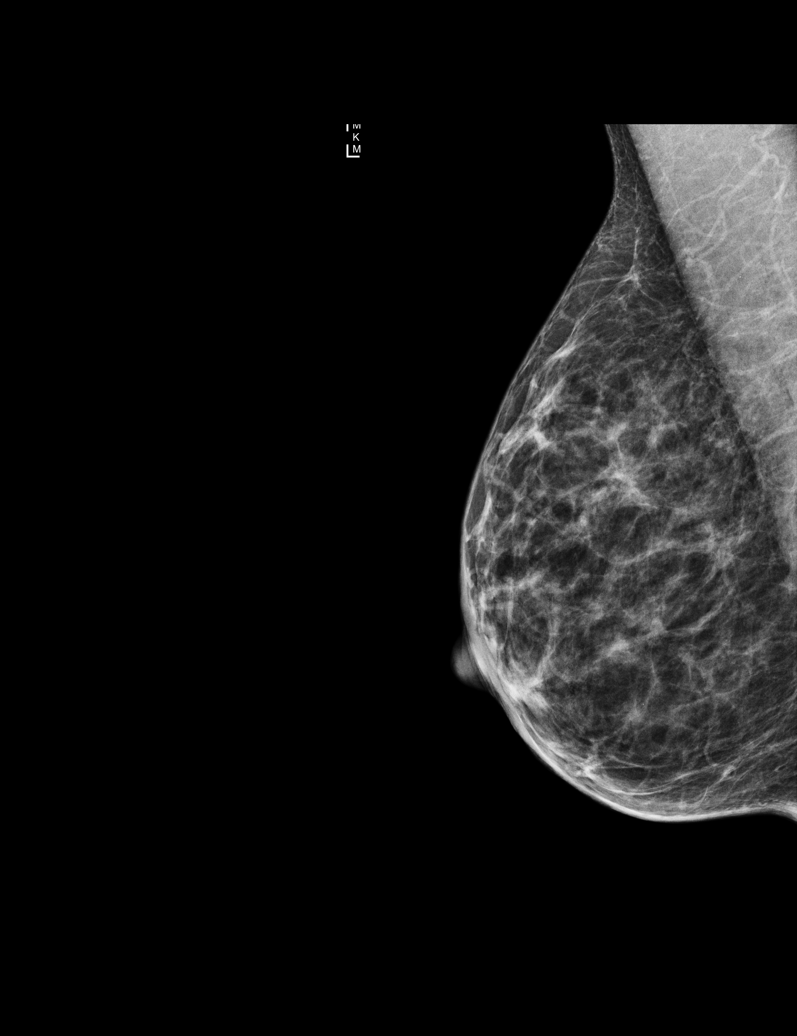

[R CC (1 of 2)]
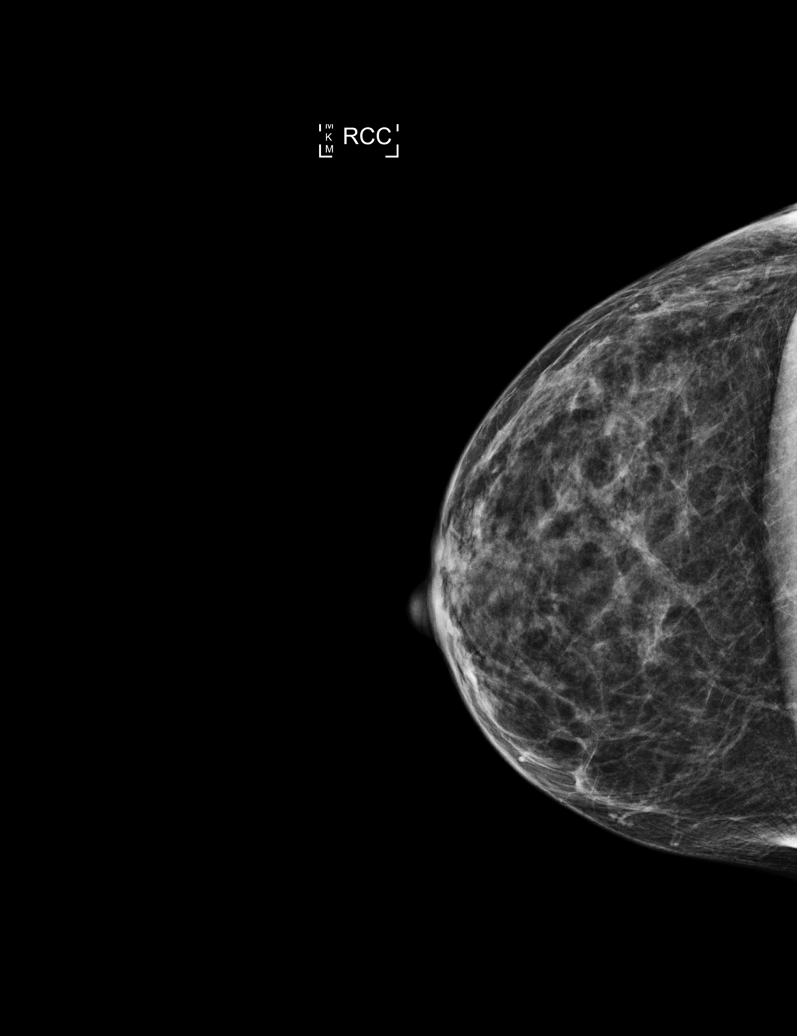

[R CC (2 of 2)]
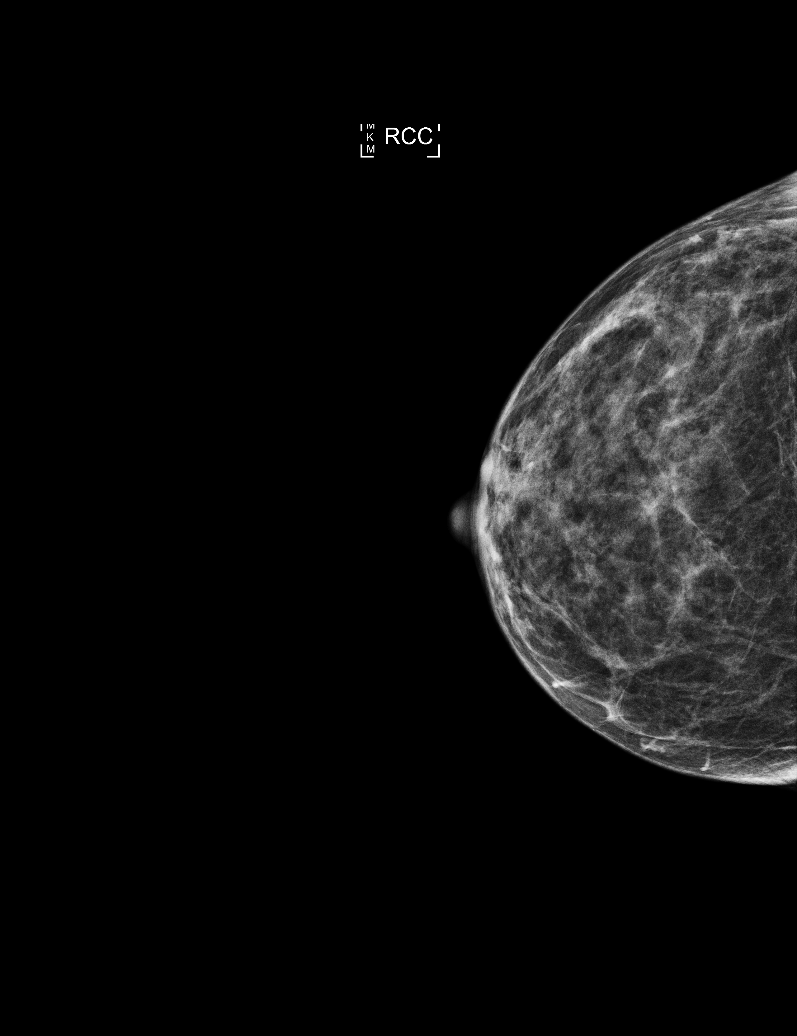

[5 of 5 positions shown; findings below may reference images not displayed]

ACR Breast Density Category c: The breast tissue is heterogeneously
dense, which may obscure small masses.
FINDINGS: There are no findings suspicious for malignancy. Images were
processed with CAD.
IMPRESSION: No mammographic evidence of malignancy. A result letter of this
screening mammogram will be mailed directly to the patient.

RECOMMENDATION:
Screening mammogram in one year. (Code:[id number])

BI-RADS CATEGORY  1: Negative.

## 2022-01-21 ENCOUNTER — Encounter: Payer: Self-pay | Admitting: Internal Medicine

## 2022-02-03 DIAGNOSIS — L820 Inflamed seborrheic keratosis: Secondary | ICD-10-CM | POA: Diagnosis not present

## 2022-02-03 DIAGNOSIS — L821 Other seborrheic keratosis: Secondary | ICD-10-CM | POA: Diagnosis not present

## 2022-02-03 DIAGNOSIS — L570 Actinic keratosis: Secondary | ICD-10-CM | POA: Diagnosis not present

## 2022-02-03 DIAGNOSIS — L814 Other melanin hyperpigmentation: Secondary | ICD-10-CM | POA: Diagnosis not present

## 2022-02-03 DIAGNOSIS — L819 Disorder of pigmentation, unspecified: Secondary | ICD-10-CM | POA: Diagnosis not present

## 2022-02-11 ENCOUNTER — Other Ambulatory Visit: Payer: Self-pay | Admitting: Obstetrics and Gynecology

## 2022-02-11 DIAGNOSIS — Z1231 Encounter for screening mammogram for malignant neoplasm of breast: Secondary | ICD-10-CM

## 2022-03-17 ENCOUNTER — Ambulatory Visit
Admission: RE | Admit: 2022-03-17 | Discharge: 2022-03-17 | Disposition: A | Discharge status: Home / Self Care | Payer: 59 | Source: Ambulatory Visit | Attending: Obstetrics and Gynecology | Admitting: Obstetrics and Gynecology

## 2022-03-17 DIAGNOSIS — Z1231 Encounter for screening mammogram for malignant neoplasm of breast: Secondary | ICD-10-CM | POA: Diagnosis not present

## 2022-04-16 DIAGNOSIS — N950 Postmenopausal bleeding: Secondary | ICD-10-CM | POA: Diagnosis not present

## 2022-06-09 DIAGNOSIS — Z01419 Encounter for gynecological examination (general) (routine) without abnormal findings: Secondary | ICD-10-CM | POA: Diagnosis not present

## 2022-06-09 DIAGNOSIS — N952 Postmenopausal atrophic vaginitis: Secondary | ICD-10-CM | POA: Diagnosis not present

## 2022-06-09 DIAGNOSIS — Z833 Family history of diabetes mellitus: Secondary | ICD-10-CM | POA: Diagnosis not present

## 2022-06-09 DIAGNOSIS — Z8349 Family history of other endocrine, nutritional and metabolic diseases: Secondary | ICD-10-CM | POA: Diagnosis not present

## 2022-06-09 DIAGNOSIS — Z681 Body mass index (BMI) 19 or less, adult: Secondary | ICD-10-CM | POA: Diagnosis not present

## 2022-06-15 DIAGNOSIS — M545 Low back pain, unspecified: Secondary | ICD-10-CM | POA: Diagnosis not present

## 2022-07-15 DIAGNOSIS — M9904 Segmental and somatic dysfunction of sacral region: Secondary | ICD-10-CM | POA: Diagnosis not present

## 2022-07-15 DIAGNOSIS — M5116 Intervertebral disc disorders with radiculopathy, lumbar region: Secondary | ICD-10-CM | POA: Diagnosis not present

## 2022-07-15 DIAGNOSIS — M9905 Segmental and somatic dysfunction of pelvic region: Secondary | ICD-10-CM | POA: Diagnosis not present

## 2022-07-15 DIAGNOSIS — M5442 Lumbago with sciatica, left side: Secondary | ICD-10-CM | POA: Diagnosis not present

## 2022-07-15 DIAGNOSIS — M4726 Other spondylosis with radiculopathy, lumbar region: Secondary | ICD-10-CM | POA: Diagnosis not present

## 2022-07-15 DIAGNOSIS — M9903 Segmental and somatic dysfunction of lumbar region: Secondary | ICD-10-CM | POA: Diagnosis not present

## 2022-07-15 DIAGNOSIS — M7918 Myalgia, other site: Secondary | ICD-10-CM | POA: Diagnosis not present

## 2022-07-17 DIAGNOSIS — M9903 Segmental and somatic dysfunction of lumbar region: Secondary | ICD-10-CM | POA: Diagnosis not present

## 2022-07-17 DIAGNOSIS — M9905 Segmental and somatic dysfunction of pelvic region: Secondary | ICD-10-CM | POA: Diagnosis not present

## 2022-07-17 DIAGNOSIS — M5116 Intervertebral disc disorders with radiculopathy, lumbar region: Secondary | ICD-10-CM | POA: Diagnosis not present

## 2022-07-17 DIAGNOSIS — M5442 Lumbago with sciatica, left side: Secondary | ICD-10-CM | POA: Diagnosis not present

## 2022-07-17 DIAGNOSIS — M7918 Myalgia, other site: Secondary | ICD-10-CM | POA: Diagnosis not present

## 2022-07-17 DIAGNOSIS — M4726 Other spondylosis with radiculopathy, lumbar region: Secondary | ICD-10-CM | POA: Diagnosis not present

## 2022-07-17 DIAGNOSIS — M9904 Segmental and somatic dysfunction of sacral region: Secondary | ICD-10-CM | POA: Diagnosis not present

## 2022-07-22 DIAGNOSIS — M5442 Lumbago with sciatica, left side: Secondary | ICD-10-CM | POA: Diagnosis not present

## 2022-07-22 DIAGNOSIS — M9903 Segmental and somatic dysfunction of lumbar region: Secondary | ICD-10-CM | POA: Diagnosis not present

## 2022-07-22 DIAGNOSIS — M7918 Myalgia, other site: Secondary | ICD-10-CM | POA: Diagnosis not present

## 2022-07-22 DIAGNOSIS — M9905 Segmental and somatic dysfunction of pelvic region: Secondary | ICD-10-CM | POA: Diagnosis not present

## 2022-07-22 DIAGNOSIS — M4726 Other spondylosis with radiculopathy, lumbar region: Secondary | ICD-10-CM | POA: Diagnosis not present

## 2022-07-22 DIAGNOSIS — M5116 Intervertebral disc disorders with radiculopathy, lumbar region: Secondary | ICD-10-CM | POA: Diagnosis not present

## 2022-07-22 DIAGNOSIS — M9904 Segmental and somatic dysfunction of sacral region: Secondary | ICD-10-CM | POA: Diagnosis not present

## 2022-08-17 DIAGNOSIS — M4726 Other spondylosis with radiculopathy, lumbar region: Secondary | ICD-10-CM | POA: Diagnosis not present

## 2022-08-17 DIAGNOSIS — M5116 Intervertebral disc disorders with radiculopathy, lumbar region: Secondary | ICD-10-CM | POA: Diagnosis not present

## 2022-08-17 DIAGNOSIS — M9903 Segmental and somatic dysfunction of lumbar region: Secondary | ICD-10-CM | POA: Diagnosis not present

## 2022-08-17 DIAGNOSIS — M9905 Segmental and somatic dysfunction of pelvic region: Secondary | ICD-10-CM | POA: Diagnosis not present

## 2022-08-17 DIAGNOSIS — M5442 Lumbago with sciatica, left side: Secondary | ICD-10-CM | POA: Diagnosis not present

## 2022-08-17 DIAGNOSIS — M9904 Segmental and somatic dysfunction of sacral region: Secondary | ICD-10-CM | POA: Diagnosis not present

## 2022-08-17 DIAGNOSIS — M7918 Myalgia, other site: Secondary | ICD-10-CM | POA: Diagnosis not present

## 2022-09-07 DIAGNOSIS — M5442 Lumbago with sciatica, left side: Secondary | ICD-10-CM | POA: Diagnosis not present

## 2022-09-07 DIAGNOSIS — M9903 Segmental and somatic dysfunction of lumbar region: Secondary | ICD-10-CM | POA: Diagnosis not present

## 2022-09-07 DIAGNOSIS — M5116 Intervertebral disc disorders with radiculopathy, lumbar region: Secondary | ICD-10-CM | POA: Diagnosis not present

## 2022-09-07 DIAGNOSIS — M7918 Myalgia, other site: Secondary | ICD-10-CM | POA: Diagnosis not present

## 2022-09-07 DIAGNOSIS — M4726 Other spondylosis with radiculopathy, lumbar region: Secondary | ICD-10-CM | POA: Diagnosis not present

## 2022-09-07 DIAGNOSIS — M9905 Segmental and somatic dysfunction of pelvic region: Secondary | ICD-10-CM | POA: Diagnosis not present

## 2022-09-07 DIAGNOSIS — M9904 Segmental and somatic dysfunction of sacral region: Secondary | ICD-10-CM | POA: Diagnosis not present

## 2022-10-03 LAB — GLUCOSE, POCT (MANUAL RESULT ENTRY): Glucose Fasting, POC: 111 mg/dL — AB (ref 70–99)

## 2022-10-31 LAB — GLUCOSE, POCT (MANUAL RESULT ENTRY): Glucose Fasting, POC: 115 mg/dL — AB (ref 70–99)

## 2023-01-06 DIAGNOSIS — M5116 Intervertebral disc disorders with radiculopathy, lumbar region: Secondary | ICD-10-CM | POA: Diagnosis not present

## 2023-01-06 DIAGNOSIS — M5442 Lumbago with sciatica, left side: Secondary | ICD-10-CM | POA: Diagnosis not present

## 2023-01-06 DIAGNOSIS — M7918 Myalgia, other site: Secondary | ICD-10-CM | POA: Diagnosis not present

## 2023-01-06 DIAGNOSIS — M9905 Segmental and somatic dysfunction of pelvic region: Secondary | ICD-10-CM | POA: Diagnosis not present

## 2023-01-06 DIAGNOSIS — M9903 Segmental and somatic dysfunction of lumbar region: Secondary | ICD-10-CM | POA: Diagnosis not present

## 2023-01-06 DIAGNOSIS — M9904 Segmental and somatic dysfunction of sacral region: Secondary | ICD-10-CM | POA: Diagnosis not present

## 2023-01-06 DIAGNOSIS — M4726 Other spondylosis with radiculopathy, lumbar region: Secondary | ICD-10-CM | POA: Diagnosis not present

## 2023-01-11 ENCOUNTER — Encounter: Payer: Self-pay | Admitting: Family Medicine

## 2023-01-29 ENCOUNTER — Other Ambulatory Visit: Payer: Self-pay | Admitting: Obstetrics and Gynecology

## 2023-01-29 DIAGNOSIS — Z1231 Encounter for screening mammogram for malignant neoplasm of breast: Secondary | ICD-10-CM

## 2023-02-01 DIAGNOSIS — M5116 Intervertebral disc disorders with radiculopathy, lumbar region: Secondary | ICD-10-CM | POA: Diagnosis not present

## 2023-02-01 DIAGNOSIS — M9905 Segmental and somatic dysfunction of pelvic region: Secondary | ICD-10-CM | POA: Diagnosis not present

## 2023-02-01 DIAGNOSIS — M9903 Segmental and somatic dysfunction of lumbar region: Secondary | ICD-10-CM | POA: Diagnosis not present

## 2023-02-01 DIAGNOSIS — M4726 Other spondylosis with radiculopathy, lumbar region: Secondary | ICD-10-CM | POA: Diagnosis not present

## 2023-02-01 DIAGNOSIS — M9904 Segmental and somatic dysfunction of sacral region: Secondary | ICD-10-CM | POA: Diagnosis not present

## 2023-02-01 DIAGNOSIS — M7918 Myalgia, other site: Secondary | ICD-10-CM | POA: Diagnosis not present

## 2023-02-01 DIAGNOSIS — M5442 Lumbago with sciatica, left side: Secondary | ICD-10-CM | POA: Diagnosis not present

## 2023-02-02 DIAGNOSIS — N6320 Unspecified lump in the left breast, unspecified quadrant: Secondary | ICD-10-CM | POA: Diagnosis not present

## 2023-02-02 DIAGNOSIS — R1904 Left lower quadrant abdominal swelling, mass and lump: Secondary | ICD-10-CM | POA: Diagnosis not present

## 2023-02-02 DIAGNOSIS — F411 Generalized anxiety disorder: Secondary | ICD-10-CM | POA: Diagnosis not present

## 2023-02-17 DIAGNOSIS — M4726 Other spondylosis with radiculopathy, lumbar region: Secondary | ICD-10-CM | POA: Diagnosis not present

## 2023-02-17 DIAGNOSIS — M9905 Segmental and somatic dysfunction of pelvic region: Secondary | ICD-10-CM | POA: Diagnosis not present

## 2023-02-17 DIAGNOSIS — M5442 Lumbago with sciatica, left side: Secondary | ICD-10-CM | POA: Diagnosis not present

## 2023-02-17 DIAGNOSIS — M9904 Segmental and somatic dysfunction of sacral region: Secondary | ICD-10-CM | POA: Diagnosis not present

## 2023-02-17 DIAGNOSIS — M5116 Intervertebral disc disorders with radiculopathy, lumbar region: Secondary | ICD-10-CM | POA: Diagnosis not present

## 2023-02-17 DIAGNOSIS — M9903 Segmental and somatic dysfunction of lumbar region: Secondary | ICD-10-CM | POA: Diagnosis not present

## 2023-02-17 DIAGNOSIS — M7918 Myalgia, other site: Secondary | ICD-10-CM | POA: Diagnosis not present

## 2023-02-19 ENCOUNTER — Encounter: Payer: Self-pay | Admitting: Family Medicine

## 2023-02-19 DIAGNOSIS — M5416 Radiculopathy, lumbar region: Secondary | ICD-10-CM

## 2023-02-22 MED ORDER — MELOXICAM 15 MG PO TABS: 15.0000 mg | ORAL_TABLET | Freq: Every day | ORAL | 0 refills | Status: DC | PRN

## 2023-03-09 DIAGNOSIS — M7918 Myalgia, other site: Secondary | ICD-10-CM | POA: Diagnosis not present

## 2023-03-09 DIAGNOSIS — M4726 Other spondylosis with radiculopathy, lumbar region: Secondary | ICD-10-CM | POA: Diagnosis not present

## 2023-03-09 DIAGNOSIS — M9905 Segmental and somatic dysfunction of pelvic region: Secondary | ICD-10-CM | POA: Diagnosis not present

## 2023-03-09 DIAGNOSIS — M5116 Intervertebral disc disorders with radiculopathy, lumbar region: Secondary | ICD-10-CM | POA: Diagnosis not present

## 2023-03-09 DIAGNOSIS — M9903 Segmental and somatic dysfunction of lumbar region: Secondary | ICD-10-CM | POA: Diagnosis not present

## 2023-03-09 DIAGNOSIS — M5442 Lumbago with sciatica, left side: Secondary | ICD-10-CM | POA: Diagnosis not present

## 2023-03-09 DIAGNOSIS — M9904 Segmental and somatic dysfunction of sacral region: Secondary | ICD-10-CM | POA: Diagnosis not present

## 2023-03-15 ENCOUNTER — Telehealth: Payer: Self-pay | Admitting: Physical Medicine and Rehabilitation

## 2023-03-15 NOTE — Telephone Encounter (Signed)
Patient called needing to schedule an appointment with Dr. Alvester Morin for the numbness in her leg (front of thigh). The number to contact patient is [phone]

## 2023-03-16 NOTE — Telephone Encounter (Signed)
Spoke with patient and she stated she is having right hip pain and numbness in thigh. Last injection was 05/2020. Scheduled OV for 03/18/23.

## 2023-03-18 ENCOUNTER — Ambulatory Visit: Payer: 59 | Admitting: Physical Medicine and Rehabilitation

## 2023-03-18 DIAGNOSIS — M5416 Radiculopathy, lumbar region: Secondary | ICD-10-CM

## 2023-03-18 DIAGNOSIS — M5116 Intervertebral disc disorders with radiculopathy, lumbar region: Secondary | ICD-10-CM

## 2023-03-18 DIAGNOSIS — G8929 Other chronic pain: Secondary | ICD-10-CM | POA: Diagnosis not present

## 2023-03-18 NOTE — Progress Notes (Signed)
4 weeks of pain now.  She can still run and do everything she stated it hurts the most when she wakes up in the am or triggered when she is sitting down.  When she sits on the toilet she gets a pain down her leg she stated that her leg feels numb and she has a constant pain above her hip on the right side the mobic didn't help at all.

## 2023-03-18 NOTE — Progress Notes (Signed)
Erin Weaver - 57 y.o. female MRN [id number]  Date of birth: February 23, 1966  Office Visit Note: Visit Date: 03/18/2023 PCP: Joselyn Arrow, MD Referred by: Joselyn Arrow, MD  Subjective: Chief Complaint  Patient presents with   Right Hip - Pain   HPI: Erin Weaver is a 57 y.o. female who comes in today for evaluation of chronic, worsening and severe bilateral lower back pain radiating to right lateral hip/thigh. Pain does not go past knee. No pain to groin. She reports issues with lower back pain and left leg symptoms severe years ago, right sided pain started about 1 month ago. Her pain worsens with sitting. She describes pain as tightness sensation, also reports numbness to right lateral thigh, currently rates as pain as 7 out of 10. Some relief of pain with home exercise regimen, ice/heat, rest and use of medications. History of chiropractic and massage therapy with minimal relief of pain. Lumbar MRI imaging from 2018 exhibits small left extraforaminal disc protrusion at L3-L4 contacting the exiting left L3 nerve root. Slightly decreased size of the broad-based disc protrusion at L4-L5. She underwent left L5-S1 interlaminar epidural steroid injection in our office on 05/09/2023, minimal relief of pain with this procedure. We have also seen her in the past for issues with ischial bursitis. Patient is a very active person, she runs daily. Patient denies focal weakness. No recent trauma or falls.      Review of Systems  Musculoskeletal:  Positive for back pain.  Neurological:  Positive for tingling. Negative for focal weakness and weakness.  All other systems reviewed and are negative.  Otherwise per HPI.  Assessment & Plan: Visit Diagnoses:    ICD-10-CM   1. Chronic bilateral low back pain with right-sided sciatica  M54.41 MR LUMBAR SPINE WO CONTRAST   G89.29     2. Lumbar radiculopathy  M54.16 MR LUMBAR SPINE WO CONTRAST    3. Intervertebral disc disorders with radiculopathy, lumbar  region  M51.16 MR LUMBAR SPINE WO CONTRAST       Plan: Findings:  Chronic, worsening and severe bilateral lower back pain radiating to right lateral hip/thigh.  Patient continues to have severe pain despite good conservative therapy such as chiropractic treatments/massage therapy, home exercise regimen, rest and use of medications.  Patient's clinical presentation and exam are consistent with L5 nerve pattern. Next step is to obtain new lumbar MRI imaging. She has history of multiple disc herniations. Depending on results of MRI imaging we discussed possibility of performing lumbar epidural steroid injection, would consider right L5 transforaminal epidural steroid injection. Could also look at re-grouping with formal physical therapy and medication management. I will see her back for lumbar MRI review. No red flag symptoms noted upon exam today.     Meds & Orders: No orders of the defined types were placed in this encounter.   Orders Placed This Encounter  Procedures   MR LUMBAR SPINE WO CONTRAST    Follow-up: Return for lumbar MRI review.   Procedures: No procedures performed      Clinical History: No specialty comments available.   She reports that she has never smoked. She has never used smokeless tobacco. No results for input(s): "HGBA1C", "LABURIC" in the last 8760 hours.  Objective:  VS:  HT:    WT:   BMI:     BP:   HR: bpm  TEMP: ( )  RESP:  Physical Exam Vitals and nursing note reviewed.  HENT:     Head: Normocephalic and  atraumatic.     Right Ear: External ear normal.     Left Ear: External ear normal.     Nose: Nose normal.     Mouth/Throat:     Mouth: Mucous membranes are moist.  Eyes:     Extraocular Movements: Extraocular movements intact.  Cardiovascular:     Rate and Rhythm: Normal rate.     Pulses: Normal pulses.  Pulmonary:     Effort: Pulmonary effort is normal.  Abdominal:     General: Abdomen is flat. There is no distension.  Musculoskeletal:         General: Tenderness present.     Cervical back: Normal range of motion.     Comments: Patient rises from seated position to standing without difficulty. Good lumbar range of motion. No pain noted with facet loading. 5/5 strength noted with bilateral hip flexion, knee flexion/extension, ankle dorsiflexion/plantarflexion and EHL. No clonus noted bilaterally. No pain upon palpation of greater trochanters. No pain with internal/external rotation of bilateral hips. Sensation intact bilaterally. Dysesthesias noted to right L5 dermatome. Negative slump test bilaterally. Ambulates without aid, gait steady.     Skin:    General: Skin is warm and dry.     Capillary Refill: Capillary refill takes less than 2 seconds.  Neurological:     General: No focal deficit present.     Mental Status: She is alert and oriented to person, place, and time.  Psychiatric:        Mood and Affect: Mood normal.        Behavior: Behavior normal.     Ortho Exam  Imaging: No results found.  Past Medical/Family/Surgical/Social History: Medications & Allergies reviewed per EMR, new medications updated. Patient Active Problem List   Diagnosis Date Noted   Back pain with right-sided sciatica 06/01/2016   Elevated LFTs 03/23/2016   Acute on chronic cholecystitis s/p lap cholecystectomy 03/24/2016 03/23/2016   Choledocholithiasis 03/23/2016   Common bile duct (CBD) obstruction s/p ERCP 03/26/2016 03/23/2016   Past Medical History:  Diagnosis Date   Choledocholithiasis 03/23/2016   Migraines    Family History  Problem Relation Age of Onset   Diabetes Mother    Hypertension Mother    Heart disease Father 72       MI   Diabetes Father    Hypertension Father    Cancer Father        skin   Sjogren's syndrome Sister    Cancer Sister        skin   Migraines Sister    Cancer Paternal Aunt        ovarian cancer   Migraines Brother    Depression Sister    Irritable bowel syndrome Sister    Migraines Sister     Migraines Sister    Migraines Sister    Migraines Sister    Past Surgical History:  Procedure Laterality Date   CESAREAN SECTION     ERCP N/A 03/26/2016   Procedure: ENDOSCOPIC RETROGRADE CHOLANGIOPANCREATOGRAPHY (ERCP);  Surgeon: Dorena Cookey, MD;  Location: [facility] ENDOSCOPY;  Service: Endoscopy;  Laterality: N/A;   LAPAROSCOPIC CHOLECYSTECTOMY SINGLE SITE WITH INTRAOPERATIVE CHOLANGIOGRAM N/A 03/24/2016   Procedure: LAPAROSCOPIC CHOLECYSTECTOMY SINGLE SITE WITH INTRAOPERATIVE CHOLANGIOGRAM;  Surgeon: Karie Soda, MD;  Location: [facility] OR;  Service: General;  Laterality: N/A;   Social History   Occupational History   Occupation: attorney; currently homemaker  Tobacco Use   Smoking status: Never   Smokeless tobacco: Never  Vaping Use   Vaping status:  Never Used  Substance and Sexual Activity   Alcohol use: Yes    Comment: 4-5 glasses of wine/week (usually 1 at a time, occasionally has 2)   Drug use: No   Sexual activity: Yes    Partners: Male    Comment: husband with vasectomy

## 2023-03-19 ENCOUNTER — Ambulatory Visit
Admission: RE | Admit: 2023-03-19 | Discharge: 2023-03-19 | Disposition: A | Discharge status: Home / Self Care | Payer: 59 | Source: Ambulatory Visit | Attending: Obstetrics and Gynecology | Admitting: Obstetrics and Gynecology

## 2023-03-19 ENCOUNTER — Other Ambulatory Visit: Payer: Self-pay | Admitting: Family Medicine

## 2023-03-19 DIAGNOSIS — Z1231 Encounter for screening mammogram for malignant neoplasm of breast: Secondary | ICD-10-CM | POA: Diagnosis not present

## 2023-03-19 DIAGNOSIS — M5416 Radiculopathy, lumbar region: Secondary | ICD-10-CM

## 2023-03-19 LAB — HM MAMMOGRAPHY

## 2023-03-19 NOTE — Telephone Encounter (Signed)
Was given #40 on 10/7.

## 2023-04-06 DIAGNOSIS — M5116 Intervertebral disc disorders with radiculopathy, lumbar region: Secondary | ICD-10-CM | POA: Diagnosis not present

## 2023-04-06 DIAGNOSIS — M4726 Other spondylosis with radiculopathy, lumbar region: Secondary | ICD-10-CM | POA: Diagnosis not present

## 2023-04-06 DIAGNOSIS — M7918 Myalgia, other site: Secondary | ICD-10-CM | POA: Diagnosis not present

## 2023-04-06 DIAGNOSIS — M9906 Segmental and somatic dysfunction of lower extremity: Secondary | ICD-10-CM | POA: Diagnosis not present

## 2023-04-06 DIAGNOSIS — M9905 Segmental and somatic dysfunction of pelvic region: Secondary | ICD-10-CM | POA: Diagnosis not present

## 2023-04-06 DIAGNOSIS — M25651 Stiffness of right hip, not elsewhere classified: Secondary | ICD-10-CM | POA: Diagnosis not present

## 2023-04-06 DIAGNOSIS — M9903 Segmental and somatic dysfunction of lumbar region: Secondary | ICD-10-CM | POA: Diagnosis not present

## 2023-04-06 DIAGNOSIS — M545 Low back pain, unspecified: Secondary | ICD-10-CM | POA: Diagnosis not present

## 2023-04-06 DIAGNOSIS — M25652 Stiffness of left hip, not elsewhere classified: Secondary | ICD-10-CM | POA: Diagnosis not present

## 2023-04-06 DIAGNOSIS — M9904 Segmental and somatic dysfunction of sacral region: Secondary | ICD-10-CM | POA: Diagnosis not present

## 2023-04-13 DIAGNOSIS — M25651 Stiffness of right hip, not elsewhere classified: Secondary | ICD-10-CM | POA: Diagnosis not present

## 2023-04-13 DIAGNOSIS — M4726 Other spondylosis with radiculopathy, lumbar region: Secondary | ICD-10-CM | POA: Diagnosis not present

## 2023-04-13 DIAGNOSIS — M7918 Myalgia, other site: Secondary | ICD-10-CM | POA: Diagnosis not present

## 2023-04-13 DIAGNOSIS — M9904 Segmental and somatic dysfunction of sacral region: Secondary | ICD-10-CM | POA: Diagnosis not present

## 2023-04-13 DIAGNOSIS — M545 Low back pain, unspecified: Secondary | ICD-10-CM | POA: Diagnosis not present

## 2023-04-13 DIAGNOSIS — M9903 Segmental and somatic dysfunction of lumbar region: Secondary | ICD-10-CM | POA: Diagnosis not present

## 2023-04-13 DIAGNOSIS — M25652 Stiffness of left hip, not elsewhere classified: Secondary | ICD-10-CM | POA: Diagnosis not present

## 2023-04-13 DIAGNOSIS — M9906 Segmental and somatic dysfunction of lower extremity: Secondary | ICD-10-CM | POA: Diagnosis not present

## 2023-04-13 DIAGNOSIS — M5116 Intervertebral disc disorders with radiculopathy, lumbar region: Secondary | ICD-10-CM | POA: Diagnosis not present

## 2023-04-13 DIAGNOSIS — M9905 Segmental and somatic dysfunction of pelvic region: Secondary | ICD-10-CM | POA: Diagnosis not present

## 2023-04-22 DIAGNOSIS — M25651 Stiffness of right hip, not elsewhere classified: Secondary | ICD-10-CM | POA: Diagnosis not present

## 2023-04-22 DIAGNOSIS — M7918 Myalgia, other site: Secondary | ICD-10-CM | POA: Diagnosis not present

## 2023-04-22 DIAGNOSIS — M9906 Segmental and somatic dysfunction of lower extremity: Secondary | ICD-10-CM | POA: Diagnosis not present

## 2023-04-22 DIAGNOSIS — M545 Low back pain, unspecified: Secondary | ICD-10-CM | POA: Diagnosis not present

## 2023-04-22 DIAGNOSIS — M5116 Intervertebral disc disorders with radiculopathy, lumbar region: Secondary | ICD-10-CM | POA: Diagnosis not present

## 2023-04-22 DIAGNOSIS — M4726 Other spondylosis with radiculopathy, lumbar region: Secondary | ICD-10-CM | POA: Diagnosis not present

## 2023-04-22 DIAGNOSIS — M9905 Segmental and somatic dysfunction of pelvic region: Secondary | ICD-10-CM | POA: Diagnosis not present

## 2023-04-22 DIAGNOSIS — M9904 Segmental and somatic dysfunction of sacral region: Secondary | ICD-10-CM | POA: Diagnosis not present

## 2023-04-22 DIAGNOSIS — M9903 Segmental and somatic dysfunction of lumbar region: Secondary | ICD-10-CM | POA: Diagnosis not present

## 2023-04-22 DIAGNOSIS — M25652 Stiffness of left hip, not elsewhere classified: Secondary | ICD-10-CM | POA: Diagnosis not present

## 2023-05-06 DIAGNOSIS — M25651 Stiffness of right hip, not elsewhere classified: Secondary | ICD-10-CM | POA: Diagnosis not present

## 2023-05-06 DIAGNOSIS — M4726 Other spondylosis with radiculopathy, lumbar region: Secondary | ICD-10-CM | POA: Diagnosis not present

## 2023-05-06 DIAGNOSIS — M9904 Segmental and somatic dysfunction of sacral region: Secondary | ICD-10-CM | POA: Diagnosis not present

## 2023-05-06 DIAGNOSIS — M545 Low back pain, unspecified: Secondary | ICD-10-CM | POA: Diagnosis not present

## 2023-05-06 DIAGNOSIS — M7918 Myalgia, other site: Secondary | ICD-10-CM | POA: Diagnosis not present

## 2023-05-06 DIAGNOSIS — M25652 Stiffness of left hip, not elsewhere classified: Secondary | ICD-10-CM | POA: Diagnosis not present

## 2023-05-06 DIAGNOSIS — M9903 Segmental and somatic dysfunction of lumbar region: Secondary | ICD-10-CM | POA: Diagnosis not present

## 2023-05-06 DIAGNOSIS — M5116 Intervertebral disc disorders with radiculopathy, lumbar region: Secondary | ICD-10-CM | POA: Diagnosis not present

## 2023-05-06 DIAGNOSIS — M9906 Segmental and somatic dysfunction of lower extremity: Secondary | ICD-10-CM | POA: Diagnosis not present

## 2023-05-06 DIAGNOSIS — M9905 Segmental and somatic dysfunction of pelvic region: Secondary | ICD-10-CM | POA: Diagnosis not present

## 2023-05-24 ENCOUNTER — Encounter: Payer: Self-pay | Admitting: Family Medicine

## 2023-05-24 ENCOUNTER — Ambulatory Visit: Payer: 59 | Admitting: Family Medicine

## 2023-05-24 ENCOUNTER — Ambulatory Visit (INDEPENDENT_AMBULATORY_CARE_PROVIDER_SITE_OTHER): Payer: 59

## 2023-05-24 ENCOUNTER — Other Ambulatory Visit: Payer: Self-pay

## 2023-05-24 VITALS — BP 102/70 | HR 61 | Ht 62.0 in | Wt 110.0 lb

## 2023-05-24 DIAGNOSIS — G8929 Other chronic pain: Secondary | ICD-10-CM

## 2023-05-24 DIAGNOSIS — M25551 Pain in right hip: Secondary | ICD-10-CM | POA: Diagnosis not present

## 2023-05-24 DIAGNOSIS — M25562 Pain in left knee: Secondary | ICD-10-CM

## 2023-05-24 DIAGNOSIS — I878 Other specified disorders of veins: Secondary | ICD-10-CM | POA: Diagnosis not present

## 2023-05-24 NOTE — Progress Notes (Signed)
 I, Leotis Batter, CMA acting as a scribe for Artist Lloyd, MD.  Erin Weaver is a 58 y.o. female who presents to [facility] today for R hip pain x 12+ weeks. She was seen previously by the NP at [facility]. Pt locates pain to lateral aspect of the hip. Notes TTP at lateral thigh. Sx worse when stepping up and after longer runs. Typically runs about 20 miles over 4-5 days per week. Feels like the hip catches and sticks at times. Has tried Meloxicam  for more severe sx. Hx of chronic LBP with herniated disc left L5-S1.   Also c/o left knee pain. Denies swelling. The knee feels numb at times. Locates pain to medial and posterior aspects. Minimal pain during activity, worsening sx following activity. Sx affecting ability to run for exercise.   She notes popping and clicking in that left knee with full range of motion loaded exercises like running or deep knee bends.  Dx imaging: 03/17/23 L-spine & pelvis MRI  Pertinent review of systems: No fevers or chills  Relevant historical information: History of lumbar radiculopathy involving left L3.  She has had several epidurals which have helped.  She is an avid runner.   Exam:  BP 102/70   Pulse 61   Ht 5' 2 (1.575 m)   Wt 110 lb (49.9 kg)   LMP 02/09/2017 (Approximate)   SpO2 98%   BMI 20.12 kg/m  General: Well Developed, well nourished, and in no acute distress.   MSK: Right hip normal-appearing Normal hip motion.  Tender palpation at lateral hip origin of tensor fascia lata. Strength reduced hip abduction and external rotation. Normal Ober's test   Left knee: Normal-appearing Mildly tender palpation posterior medial knee.  Normal knee motion.  Stable ligamentous exam. Intact strength. Negative McMurray's test.   Lab and Radiology Results  Procedure: Real-time Ultrasound Guided Injection of left knee joint superior lateral patella space Device: Philips Affiniti 50G/GE Logiq Images  permanently stored and available for review in PACS Verbal informed consent obtained.  Discussed risks and benefits of procedure. Warned about infection, bleeding, hyperglycemia damage to structures among others. Patient expresses understanding and agreement Time-out conducted.   Noted no overlying erythema, induration, or other signs of local infection.   Skin prepped in a sterile fashion.   Local anesthesia: Topical Ethyl chloride.   With sterile technique and under real time ultrasound guidance: 40 mg of Kenalog  and 2 mL of Marcaine  injected into knee joint. Fluid seen entering the joint capsule.   Completed without difficulty   Pain immediately resolved suggesting accurate placement of the medication.   Advised to call if fevers/chills, erythema, induration, drainage, or persistent bleeding.   Images permanently stored and available for review in the ultrasound unit.  Impression: Technically successful ultrasound guided injection.   X-ray images right hip and left knee obtained today personally and independently interpreted.  Right hip: No acute fractures.  No significant hip arthritis.  No visible abnormalities at the lateral iliac crest.  Mild enthesiopathy at the superior portion of the greater trochanter.  Left knee: Minimal lateral DJD.  No acute fractures.  Await formal radiology review     Assessment and Plan: 58 y.o. female with chronic right lateral hip pain.  Patient points to the lateral iliac crest as the main area of pain.  This is associated with some weakness to hip abduction.  Differential includes IT tendinitis at the origin lateral iliac crest.  Plan for physical  therapy trial including iontophoresis.  Consider steroid injection if needed in the future.  Additionally she notes left knee pain.  She has mechanical symptoms and fullness with full knee flexion.  This is interfere with her ability to exercise.  Plan for steroid injection today and physical therapy.   Recheck in 6 weeks.   PDMP not reviewed this encounter. Orders Placed This Encounter  Procedures   US  LIMITED JOINT SPACE STRUCTURES LOW BILAT(NO LINKED CHARGES)    Reason for Exam (SYMPTOM  OR DIAGNOSIS REQUIRED):   right hip, left knee    Preferred imaging location?:   [facility]   DG HIP UNILAT W OR W/O PELVIS 2-3 VIEWS RIGHT    Standing Status:   Future    Number of Occurrences:   1    Expiration Date:   06/24/2023    Reason for Exam (SYMPTOM  OR DIAGNOSIS REQUIRED):   right hip pain    Preferred imaging location?:   [facility]    Is patient pregnant?:   No   DG Knee AP/LAT W/Sunrise Left    Standing Status:   Future    Number of Occurrences:   1    Expiration Date:   06/24/2023    Reason for Exam (SYMPTOM  OR DIAGNOSIS REQUIRED):   left knee pain    Preferred imaging location?:    [place]    Is patient pregnant?:   No   Ambulatory referral to Physical Therapy    Referral Priority:   Routine    Referral Type:   Physical Medicine    Referral Reason:   Specialty Services Required    Requested Specialty:   Physical Therapy    Number of Visits Requested:   1   No orders of the defined types were placed in this encounter.    Discussed warning signs or symptoms. Please see discharge instructions. Patient expresses understanding.   The above documentation has been reviewed and is accurate and complete Artist Lloyd, M.D.

## 2023-05-24 NOTE — Patient Instructions (Addendum)
 Thank you for coming in today.   Please get an Xray today before you leave   I've referred you to Physical Therapy.  Let us  know if you don't hear from them in one week.   Please use Voltaren gel (Generic Diclofenac Gel) up to 4x daily for pain as needed.  This is available over-the-counter as both the name brand Voltaren gel and the generic diclofenac gel.   You received an injection today. Seek immediate medical attention if the joint becomes red, extremely painful, or is oozing fluid.   Check back in 6 weeks

## 2023-05-26 ENCOUNTER — Encounter: Payer: Self-pay | Admitting: Physical Therapy

## 2023-05-26 ENCOUNTER — Ambulatory Visit: Payer: 59 | Admitting: Physical Therapy

## 2023-05-26 DIAGNOSIS — M25562 Pain in left knee: Secondary | ICD-10-CM | POA: Diagnosis not present

## 2023-05-26 DIAGNOSIS — M6281 Muscle weakness (generalized): Secondary | ICD-10-CM | POA: Diagnosis not present

## 2023-05-26 DIAGNOSIS — M25551 Pain in right hip: Secondary | ICD-10-CM
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
## 2023-05-27 ENCOUNTER — Ambulatory Visit: Payer: 59 | Admitting: Physical Therapy

## 2023-05-27 ENCOUNTER — Encounter: Payer: Self-pay | Admitting: Physical Therapy

## 2023-05-27 DIAGNOSIS — M25562 Pain in left knee: Secondary | ICD-10-CM | POA: Diagnosis not present

## 2023-05-27 DIAGNOSIS — M6281 Muscle weakness (generalized): Secondary | ICD-10-CM | POA: Diagnosis not present

## 2023-05-27 DIAGNOSIS — M25551 Pain in right hip: Secondary | ICD-10-CM
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
## 2023-05-31 NOTE — Progress Notes (Signed)
 Left knee x-ray looks normal to radiology

## 2023-05-31 NOTE — Progress Notes (Signed)
 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  However if that is true that has been true your entire life.  I would expect pain from hip dysplasia to be more in the front of the hip and not on the outside part of the hip.  I do not think the x-ray is explaining your pain very well.

## 2023-06-02 ENCOUNTER — Ambulatory Visit: Payer: 59 | Admitting: Physical Therapy

## 2023-06-02 ENCOUNTER — Encounter: Payer: Self-pay | Admitting: Physical Therapy

## 2023-06-02 DIAGNOSIS — M25551 Pain in right hip: Secondary | ICD-10-CM

## 2023-06-02 DIAGNOSIS — M6281 Muscle weakness (generalized): Secondary | ICD-10-CM

## 2023-06-02 DIAGNOSIS — M25562 Pain in left knee: Secondary | ICD-10-CM
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
## 2023-06-03 ENCOUNTER — Encounter: Payer: 59 | Admitting: Physical Therapy

## 2023-06-07 ENCOUNTER — Encounter: Payer: Self-pay | Admitting: Physical Therapy

## 2023-06-07 ENCOUNTER — Ambulatory Visit: Payer: 59 | Admitting: Physical Therapy

## 2023-06-07 DIAGNOSIS — M6281 Muscle weakness (generalized): Secondary | ICD-10-CM | POA: Diagnosis not present

## 2023-06-07 DIAGNOSIS — M25562 Pain in left knee: Secondary | ICD-10-CM

## 2023-06-07 DIAGNOSIS — M25551 Pain in right hip: Secondary | ICD-10-CM | POA: Diagnosis not present
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
## 2023-06-08 NOTE — Patient Instructions (Incomplete)

## 2023-06-09 ENCOUNTER — Encounter: Payer: Self-pay | Admitting: Physical Therapy

## 2023-06-09 ENCOUNTER — Ambulatory Visit: Payer: 59 | Admitting: Physical Therapy

## 2023-06-09 ENCOUNTER — Encounter: Payer: Self-pay | Admitting: Family Medicine

## 2023-06-09 ENCOUNTER — Ambulatory Visit: Payer: Self-pay | Admitting: Family Medicine

## 2023-06-09 VITALS — BP 100/60 | HR 52 | Ht 62.0 in | Wt 110.8 lb

## 2023-06-09 DIAGNOSIS — Z5181 Encounter for therapeutic drug level monitoring: Secondary | ICD-10-CM

## 2023-06-09 DIAGNOSIS — M6281 Muscle weakness (generalized): Secondary | ICD-10-CM | POA: Diagnosis not present

## 2023-06-09 DIAGNOSIS — R002 Palpitations: Secondary | ICD-10-CM

## 2023-06-09 DIAGNOSIS — G43909 Migraine, unspecified, not intractable, without status migrainosus: Secondary | ICD-10-CM | POA: Diagnosis not present

## 2023-06-09 DIAGNOSIS — Z780 Asymptomatic menopausal state: Secondary | ICD-10-CM | POA: Diagnosis not present

## 2023-06-09 DIAGNOSIS — E559 Vitamin D deficiency, unspecified: Secondary | ICD-10-CM

## 2023-06-09 DIAGNOSIS — Z13220 Encounter for screening for lipoid disorders: Secondary | ICD-10-CM

## 2023-06-09 DIAGNOSIS — Z8349 Family history of other endocrine, nutritional and metabolic diseases: Secondary | ICD-10-CM | POA: Diagnosis not present

## 2023-06-09 DIAGNOSIS — J305 Allergic rhinitis due to food: Secondary | ICD-10-CM

## 2023-06-09 DIAGNOSIS — M25551 Pain in right hip: Secondary | ICD-10-CM | POA: Diagnosis not present

## 2023-06-09 DIAGNOSIS — F411 Generalized anxiety disorder: Secondary | ICD-10-CM | POA: Diagnosis not present

## 2023-06-09 DIAGNOSIS — M25562 Pain in left knee: Secondary | ICD-10-CM | POA: Diagnosis not present

## 2023-06-09 DIAGNOSIS — Z0000 Encounter for general adult medical examination without abnormal findings: Secondary | ICD-10-CM

## 2023-06-09 DIAGNOSIS — M8580 Other specified disorders of bone density and structure, unspecified site: Secondary | ICD-10-CM | POA: Diagnosis not present

## 2023-06-09 LAB — LIPID PANEL

## 2023-06-09 MED ORDER — NURTEC 75 MG PO TBDP: 1.0000 | ORAL_TABLET | Freq: Every day | ORAL | Status: DC | PRN

## 2023-06-09 MED ORDER — UBRELVY 50 MG PO TABS: 1.0000 | ORAL_TABLET | Freq: Every day | ORAL | Status: AC | PRN
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
## 2023-06-10 ENCOUNTER — Encounter: Payer: Self-pay | Admitting: Family Medicine

## 2023-06-10 DIAGNOSIS — M8580 Other specified disorders of bone density and structure, unspecified site: Secondary | ICD-10-CM | POA: Insufficient documentation

## 2023-06-10 DIAGNOSIS — E559 Vitamin D deficiency, unspecified: Secondary | ICD-10-CM | POA: Insufficient documentation

## 2023-06-10 DIAGNOSIS — R002 Palpitations: Secondary | ICD-10-CM | POA: Insufficient documentation

## 2023-06-10 DIAGNOSIS — G43909 Migraine, unspecified, not intractable, without status migrainosus: Secondary | ICD-10-CM | POA: Insufficient documentation

## 2023-06-10 DIAGNOSIS — F411 Generalized anxiety disorder: Secondary | ICD-10-CM | POA: Insufficient documentation

## 2023-06-10 LAB — CBC WITH DIFFERENTIAL/PLATELET
Basophils Absolute: 0 10*3/uL (ref 0.0–0.2)
Basos: 1 %
EOS (ABSOLUTE): 0 10*3/uL (ref 0.0–0.4)
Eos: 1 %
Hematocrit: 38.4 % (ref 34.0–46.6)
Hemoglobin: 12.9 g/dL (ref 11.1–15.9)
Immature Grans (Abs): 0 10*3/uL (ref 0.0–0.1)
Immature Granulocytes: 0 %
Lymphocytes Absolute: 1.7 10*3/uL (ref 0.7–3.1)
Lymphs: 31 %
MCH: 31.8 pg (ref 26.6–33.0)
MCHC: 33.6 g/dL (ref 31.5–35.7)
MCV: 95 fL (ref 79–97)
Monocytes Absolute: 0.3 10*3/uL (ref 0.1–0.9)
Monocytes: 6 %
Neutrophils Absolute: 3.4 10*3/uL (ref 1.4–7.0)
Neutrophils: 61 %
Platelets: 250 10*3/uL (ref 150–450)
RBC: 4.06 x10E6/uL (ref 3.77–5.28)
RDW: 12.6 % (ref 11.7–15.4)
WBC: 5.6 10*3/uL (ref 3.4–10.8)

## 2023-06-10 LAB — COMPREHENSIVE METABOLIC PANEL
ALT: 19 IU/L (ref 0–32)
AST: 24 IU/L (ref 0–40)
Albumin: 4.6 g/dL (ref 3.8–4.9)
Alkaline Phosphatase: 56 IU/L (ref 44–121)
BUN/Creatinine Ratio: 13 (ref 9–23)
BUN: 11 mg/dL (ref 6–24)
Bilirubin Total: 0.5 mg/dL (ref 0.0–1.2)
CO2: 22 mmol/L (ref 20–29)
Calcium: 9.5 mg/dL (ref 8.7–10.2)
Chloride: 104 mmol/L (ref 96–106)
Creatinine, Ser: 0.84 mg/dL (ref 0.57–1.00)
Globulin, Total: 2.1 g/dL (ref 1.5–4.5)
Glucose: 84 mg/dL (ref 70–99)
Potassium: 3.8 mmol/L (ref 3.5–5.2)
Sodium: 140 mmol/L (ref 134–144)
Total Protein: 6.7 g/dL (ref 6.0–8.5)
eGFR: 82 mL/min/{1.73_m2} (ref >59)

## 2023-06-10 LAB — LIPID PANEL
Cholesterol, Total: 218 mg/dL — ABNORMAL HIGH (ref 100–199)
HDL: 98 mg/dL (ref >39)
LDL CALC COMMENT:: 2.2 ratio (ref 0.0–4.4)
LDL Chol Calc (NIH): 112 mg/dL — ABNORMAL HIGH (ref 0–99)
Triglycerides: 45 mg/dL (ref 0–149)
VLDL Cholesterol Cal: 8 mg/dL (ref 5–40)

## 2023-06-10 LAB — VITAMIN D 25 HYDROXY (VIT D DEFICIENCY, FRACTURES): Vit D, 25-Hydroxy: 29.5 ng/mL — ABNORMAL LOW (ref 30.0–100.0)

## 2023-06-10 LAB — HEMOGLOBIN A1C
Est. average glucose Bld gHb Est-mCnc: 105 mg/dL
Hgb A1c MFr Bld: 5.3 % (ref 4.8–5.6)

## 2023-06-10 LAB — GAMMA GT: GGT: 14 IU/L (ref 0–60)

## 2023-06-10 LAB — TSH: TSH: 3.81 u[IU]/mL (ref 0.450–4.500)

## 2023-06-10 LAB — HIGH SENSITIVITY CRP

## 2023-06-10 LAB — URIC ACID: Uric Acid: 5 mg/dL (ref 3.0–7.2)

## 2023-06-10 LAB — INSULIN, RANDOM: INSULIN: 4.1 u[IU]/mL (ref 2.6–24.9)

## 2023-06-15 DIAGNOSIS — Z760 Encounter for issue of repeat prescription: Secondary | ICD-10-CM | POA: Diagnosis not present

## 2023-06-15 DIAGNOSIS — Z6820 Body mass index (BMI) 20.0-20.9, adult: Secondary | ICD-10-CM | POA: Diagnosis not present

## 2023-06-15 DIAGNOSIS — Z01419 Encounter for gynecological examination (general) (routine) without abnormal findings: Secondary | ICD-10-CM | POA: Diagnosis not present

## 2023-06-15 DIAGNOSIS — N952 Postmenopausal atrophic vaginitis: Secondary | ICD-10-CM | POA: Diagnosis not present

## 2023-06-21 ENCOUNTER — Ambulatory Visit: Payer: 59 | Admitting: Physical Therapy

## 2023-06-21 ENCOUNTER — Encounter: Payer: Self-pay | Admitting: Physical Therapy

## 2023-06-21 DIAGNOSIS — M25551 Pain in right hip: Secondary | ICD-10-CM | POA: Diagnosis not present

## 2023-06-21 DIAGNOSIS — M25562 Pain in left knee: Secondary | ICD-10-CM

## 2023-06-21 DIAGNOSIS — M6281 Muscle weakness (generalized): Secondary | ICD-10-CM | POA: Diagnosis not present
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
## 2023-06-23 ENCOUNTER — Encounter: Payer: 59 | Admitting: Physical Therapy

## 2023-06-30 ENCOUNTER — Encounter: Payer: 59 | Admitting: Physical Therapy

## 2023-07-01 ENCOUNTER — Encounter: Payer: Self-pay | Admitting: Physical Therapy

## 2023-07-01 ENCOUNTER — Ambulatory Visit: Payer: 59 | Admitting: Physical Therapy

## 2023-07-01 DIAGNOSIS — M25562 Pain in left knee: Secondary | ICD-10-CM

## 2023-07-01 DIAGNOSIS — M6281 Muscle weakness (generalized): Secondary | ICD-10-CM

## 2023-07-01 DIAGNOSIS — M25551 Pain in right hip: Secondary | ICD-10-CM
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
## 2023-07-05 ENCOUNTER — Ambulatory Visit: Payer: 59 | Admitting: Family Medicine

## 2023-07-05 ENCOUNTER — Other Ambulatory Visit: Payer: Self-pay

## 2023-07-05 VITALS — BP 100/60 | HR 70 | Ht 62.0 in | Wt 112.4 lb

## 2023-07-05 DIAGNOSIS — G8929 Other chronic pain: Secondary | ICD-10-CM

## 2023-07-05 DIAGNOSIS — M25562 Pain in left knee: Secondary | ICD-10-CM | POA: Diagnosis not present

## 2023-07-05 DIAGNOSIS — M25551 Pain in right hip: Secondary | ICD-10-CM | POA: Diagnosis not present

## 2023-07-05 NOTE — Progress Notes (Signed)
 I, Rolland Bimler am a scribe for Dr. Denyse Amass.   Erin Weaver is a 58 y.o. female who presents to [facility] today for f/u R hip and L knee pain. Pt was last seen by Dr. Denyse Amass on 05/24/23 and was referred to PT, completing 7 visits. She was also given a L knee steroid injection.  Today, pt reports injection last time did not help. Did not make anything worse. Not seeing any improvement until she does nothing at all. Moderate to fast walking aggregates it.  Meloxicam helps when she has a bad day but tries not to use it. Uses heating pad as well. By the next morning it was ok. Pain does not linger.   She notes significant clunk in the left knee especially with a loaded and flexed positioning or with rotating the knee.  This is painful.  Cortisone shot did not help very much.  Dx imaging:  03/17/23 L-spine & pelvis MRI   Pertinent review of systems: No fevers or chills  Relevant historical information: Migraine   Exam:  BP 100/60   Pulse 70   Ht 5\' 2"  (1.575 m)   Wt 112 lb 6.4 oz (51 kg)   LMP 02/09/2017 (Approximate)   SpO2 99%   BMI 20.56 kg/m  General: Well Developed, well nourished, and in no acute distress.   MSK: Right hip tender palpation lateral iliac crest.  Left knee normal-appearing normal motion.  Tender palpation medial joint line.  Positive medial McMurray's test.    Lab and Radiology Results  Procedure: Real-time Ultrasound Guided Injection of right lateral hip iliac crest Device: Philips Affiniti 50G/GE Logiq Images permanently stored and available for review in PACS Verbal informed consent obtained.  Discussed risks and benefits of procedure. Warned about infection, bleeding, hyperglycemia damage to structures among others. Patient expresses understanding and agreement Time-out conducted.   Noted no overlying erythema, induration, or other signs of local infection.   Skin prepped in a sterile fashion.   Local anesthesia:  Topical Ethyl chloride.   With sterile technique and under real time ultrasound guidance: 40 mg of Kenalog and 2 mL of Marcaine injected into area of tenderness at lateral iliac crest. Fluid seen entering the iliac crest.   Completed without difficulty   Pain immediately resolved suggesting accurate placement of the medication.   Advised to call if fevers/chills, erythema, induration, drainage, or persistent bleeding.   Images permanently stored and available for review in the ultrasound unit.  Impression: Technically successful ultrasound guided injection.        Assessment and Plan: 58 y.o. female with right lateral hip pain iliac crest.  Likely partial tear at the origin of the hip abductor muscles.  She did not improve with PT.  Plan for steroid injection today.  If not better consider MRI.  Left medial mechanical knee pain.  She did not improve with an injection and PT.  Concern for medial meniscus tear.  She may be a surgical candidate based on her mechanical symptoms.  Plan for MRI to further characterize source of pain and for surgical planning.   PDMP not reviewed this encounter. Orders Placed This Encounter  Procedures   [place] LIMITED JOINT SPACE STRUCTURES LOW RIGHT(NO LINKED CHARGES)    Reason for Exam (SYMPTOM  OR DIAGNOSIS REQUIRED):   right hip pain    Preferred imaging location?:   [facility]   MR Knee Left  Wo Contrast    Standing Status:  Future    Expiration Date:   07/04/2024    What is the patient's sedation requirement?:   No Sedation    Does the patient have a pacemaker or implanted devices?:   No    Preferred imaging location?:   [facility] (table limit-550lbs)   No orders of the defined types were placed in this encounter.    Discussed warning signs or symptoms. Please see discharge instructions. Patient expresses understanding.   The above documentation has been reviewed and is accurate and complete Clementeen Graham,  M.D.

## 2023-07-05 NOTE — Patient Instructions (Addendum)
 Thank you for coming today. MRI ordered for Left knee at [facility]

## 2023-07-06 ENCOUNTER — Ambulatory Visit: Payer: 59 | Admitting: Allergy & Immunology

## 2023-07-06 ENCOUNTER — Encounter: Payer: Self-pay | Admitting: Allergy & Immunology

## 2023-07-06 ENCOUNTER — Other Ambulatory Visit: Payer: Self-pay

## 2023-07-06 VITALS — BP 110/60 | HR 68 | Temp 98.1°F | Resp 18 | Ht 62.0 in | Wt 111.5 lb

## 2023-07-06 DIAGNOSIS — R0981 Nasal congestion: Secondary | ICD-10-CM

## 2023-07-06 DIAGNOSIS — J310 Chronic rhinitis: Secondary | ICD-10-CM | POA: Diagnosis not present

## 2023-07-06 DIAGNOSIS — T63481D Toxic effect of venom of other arthropod, accidental (unintentional), subsequent encounter: Secondary | ICD-10-CM | POA: Diagnosis not present

## 2023-07-06 NOTE — Progress Notes (Signed)
 NEW PATIENT  Date of Service/Encounter:  07/06/23  Consult requested by: Joselyn Arrow, MD   Assessment:   Chronic rhinitis - with nasal congestion that worsens in the outdoors and during exercise  Insect sting allergy  History of vertigo   History of broken nose   Plan/Recommendations:   Intermittent nasal congestion - Because of insurance stipulations, we cannot do skin testing on the same day as your first visit. - We are all working to fight this, but for now we need to do two separate visits.  - We will know more after we do testing at the next visit.  - The skin testing visit can be squeezed in at your convenience.  - Then we can make a more full plan to address all of your symptoms. - Be sure to stop your antihistamines for 3 days before this appointment.   2. Insect sting allergy - We will do some testing to look for a stinging insect allergy.  - We can send in an EpiPen.     3. Return in about 1 week (around 07/13/2023). You can have the follow up appointment with Dr. Dellis Anes or a Nurse Practicioner (our Nurse Practitioners are excellent and always have Physician oversight!).     This note in its entirety was forwarded to the Provider who requested this consultation.  Subjective:   Erin Weaver is a 58 y.o. female presenting today for evaluation of  Chief Complaint  Patient presents with   Facial Swelling    Nasal passages swell and close    Erin Weaver has a history of the following: Patient Active Problem List   Diagnosis Date Noted   Generalized anxiety disorder 06/10/2023   Osteopenia 06/10/2023   Migraine without status migrainosus, not intractable 06/10/2023   Vitamin D deficiency 06/10/2023   Palpitations 06/10/2023   Back pain with right-sided sciatica 06/01/2016   Elevated LFTs 03/23/2016   Acute on chronic cholecystitis s/p lap cholecystectomy 03/24/2016 03/23/2016   Choledocholithiasis 03/23/2016   Common bile duct (CBD)  obstruction s/p ERCP 03/26/2016 03/23/2016    History obtained from: chart review and patient.  Discussed the use of AI scribe software for clinical note transcription with the patient and/or guardian, who gave verbal consent to proceed.  Floyce Stakes was referred by Joselyn Arrow, MD.     Erin Weaver is a 58 y.o. female presenting for an evaluation of nasal congestion .  [name] is a 59 year old female who presents with nasal swelling and difficulty breathing through the nose. She was referred by Dr. Clarene Critchley for evaluation of her nasal symptoms.  She has experienced nasal swelling inside her nose, leading to difficulty breathing through her nose for the past year and a half. This significantly affects her ability to breathe while running, an activity she has engaged in for over thirty years. The swelling occurs after eating certain meals, although she has been unable to identify specific food triggers despite maintaining a food diary. No typical allergy symptoms such as congestion, watery eyes, or a runny nose are present, and she describes the sensation as her nose 'swollen up inside.'  She has a history of allergies, although she has never been formally tested. Her siblings have been tested, and she is aware of her reactions to certain allergens. She can be around cats but cannot pet them without experiencing symptoms. She has tried using loratadine (Claritin) for her symptoms, but it has not been effective. She has not used  nasal sprays like Flonase or Nasacort.  She has a history of a broken nose twice, but her symptoms are not constant, and previous evaluations did not suggest structural issues. There is no seasonal variation in her symptoms, and her symptoms worsen with outdoor exercise but not with indoor activities like using the elliptical. She has never had a sinus CT. She was referred to ENT at one point in 2022 for an evaluation of vertigo, but she ended up not following through with  that.   She has a history of a severe reaction to a bee sting in high school, which caused her to faint, but she has not been stung since. She experienced significant swelling from a hornet sting, which resolved over three weeks. She spends a lot of time outdoors but has not had recent stings.     Otherwise, there is no history of other atopic diseases, including drug allergies, stinging insect allergies, or contact dermatitis. There is no significant infectious history. Vaccinations are up to date.    Past Medical History: Patient Active Problem List   Diagnosis Date Noted   Generalized anxiety disorder 06/10/2023   Osteopenia 06/10/2023   Migraine without status migrainosus, not intractable 06/10/2023   Vitamin D deficiency 06/10/2023   Palpitations 06/10/2023   Back pain with right-sided sciatica 06/01/2016   Elevated LFTs 03/23/2016   Acute on chronic cholecystitis s/p lap cholecystectomy 03/24/2016 03/23/2016   Choledocholithiasis 03/23/2016   Common bile duct (CBD) obstruction s/p ERCP 03/26/2016 03/23/2016    Medication List:  Allergies as of 07/06/2023       Reactions   Erythromycin Rash   Sulfa Antibiotics Rash        Medication List        Accurate as of July 06, 2023  4:06 PM. If you have any questions, ask your nurse or doctor.          acetaminophen 325 MG tablet Commonly known as: TYLENOL Take 650 mg by mouth every 6 (six) hours as needed.   diphenhydramine-acetaminophen 25-500 MG Tabs tablet Commonly known as: TYLENOL PM Take 1 tablet by mouth at bedtime as needed.   escitalopram 10 MG tablet Commonly known as: LEXAPRO Take 10 mg by mouth daily.   estradiol 0.1 MG/GM vaginal cream Commonly known as: ESTRACE Place 1 Applicatorful vaginally once a week.   ibuprofen 200 MG tablet Commonly known as: ADVIL Take 200 mg by mouth every 6 (six) hours as needed.   meloxicam 15 MG tablet Commonly known as: MOBIC Take 1 tablet (15 mg total) by mouth  daily as needed for pain.   naproxen sodium 220 MG tablet Commonly known as: ALEVE Take 220-440 mg by mouth.   Nurtec 75 MG Tbdp Generic drug: Rimegepant Sulfate Take 1 tablet (75 mg total) by mouth daily as needed (migraine).   Ubrelvy 50 MG Tabs Generic drug: Ubrogepant Take 1-2 tablets (50-100 mg total) by mouth daily as needed (migraine). May repeat dose once after 2 hours, if needed (max 200 mg/24 hours)        Birth History: non-contributory  Developmental History: non-contributory  Past Surgical History: Past Surgical History:  Procedure Laterality Date   CESAREAN SECTION     ERCP N/A 03/26/2016   Procedure: ENDOSCOPIC RETROGRADE CHOLANGIOPANCREATOGRAPHY (ERCP);  Surgeon: Dorena Cookey, MD;  Location: [facility] ENDOSCOPY;  Service: Endoscopy;  Laterality: N/A;   LAPAROSCOPIC CHOLECYSTECTOMY SINGLE SITE WITH INTRAOPERATIVE CHOLANGIOGRAM N/A 03/24/2016   Procedure: LAPAROSCOPIC CHOLECYSTECTOMY SINGLE SITE WITH INTRAOPERATIVE CHOLANGIOGRAM;  Surgeon: Viviann Spare  Gross, MD;  Location: [facility] OR;  Service: General;  Laterality: N/A;     Family History: Family History  Problem Relation Age of Onset   Diabetes Mother    Hypertension Mother    Skin cancer Father    Heart disease Father 55       MI   Diabetes Father    Hypertension Father    Asthma Sister    Allergic rhinitis Sister    Sjogren's syndrome Sister    Migraines Sister    Skin cancer Sister    Thyroid disease Sister    Allergic rhinitis Sister    Depression Sister    Irritable bowel syndrome Sister    Migraines Sister    Asthma Sister    Allergic rhinitis Sister    Hyperlipidemia Sister    Thyroid disease Sister    Migraines Sister    Gestational diabetes Sister    Allergic rhinitis Sister    Hyperlipidemia Sister    Thyroid disease Sister    Migraines Sister    Allergic rhinitis Sister    Gestational diabetes Sister    Thyroid disease Sister    Migraines Sister    Hyperlipidemia Sister    Allergic rhinitis  Brother    Allergic rhinitis Brother    Migraines Brother    Ovarian cancer Paternal Aunt 30   Migraines Daughter    Migraines Daughter    BRCA 1/2 Neg Hx    Breast cancer Neg Hx    Angioedema Neg Hx    Eczema Neg Hx    Urticaria Neg Hx      Social History: Shaleka lives at home with her family.  She was in a house that is 56 years old.  There is 1 and carpeting in the main living areas and carpeting in the bedroom.  There is gas heating and central cooling.  There is a dog inside of the home.  There are no dust mite covers on the bedding.  There is no tobacco exposure.  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  She is there are no fumes, chemicals, or dust exposures.  She does not live near an interstate or industrial area.  There is no tobacco exposure.   Review of systems otherwise negative other than that mentioned in the HPI.    Objective:   Blood pressure 110/60, pulse 68, temperature 98.1 F (36.7 C), temperature source Temporal, resp. rate 18, height 5\' 2"  (1.575 m), weight 111 lb 8 oz (50.6 kg), last menstrual period 02/09/2017, SpO2 98%. Body mass index is 20.39 kg/m.     Physical Exam Vitals reviewed.  Constitutional:      Appearance: She is well-developed.     Comments: Very talkative.  HENT:     Head: Normocephalic and atraumatic.     Right Ear: Tympanic membrane, ear canal and external ear normal. No drainage, swelling or tenderness. Tympanic membrane is not injected, scarred, erythematous, retracted or bulging.     Left Ear: Tympanic membrane, ear canal and external ear normal. No drainage, swelling or tenderness. Tympanic membrane is not injected, scarred, erythematous, retracted or bulging.     Nose: No nasal deformity, septal deviation, mucosal edema or rhinorrhea.     Right Turbinates: Enlarged, swollen and pale.     Left Turbinates: Enlarged, swollen and pale.     Right Sinus: No maxillary sinus tenderness or frontal sinus tenderness.      Left Sinus: No maxillary sinus tenderness or frontal  sinus tenderness.     Comments: No nasal polyps.    Mouth/Throat:     Lips: Pink.     Mouth: Mucous membranes are moist. Mucous membranes are not pale and not dry.     Pharynx: Oropharynx is clear. Uvula midline.  Eyes:     General:        Right eye: No discharge.        Left eye: No discharge.     Conjunctiva/sclera: Conjunctivae normal.     Right eye: Right conjunctiva is not injected. No chemosis.    Left eye: Left conjunctiva is not injected. No chemosis.    Pupils: Pupils are equal, round, and reactive to light.  Cardiovascular:     Rate and Rhythm: Normal rate and regular rhythm.     Heart sounds: Normal heart sounds.  Pulmonary:     Effort: Pulmonary effort is normal. No tachypnea, accessory muscle usage or respiratory distress.     Breath sounds: Normal breath sounds. No wheezing, rhonchi or rales.  Chest:     Chest wall: No tenderness.  Lymphadenopathy:     Head:     Right side of head: No submandibular, tonsillar or occipital adenopathy.     Left side of head: No submandibular, tonsillar or occipital adenopathy.     Cervical: No cervical adenopathy.  Skin:    Coloration: Skin is not pale.     Findings: No abrasion, erythema, petechiae or rash. Rash is not papular, urticarial or vesicular.  Neurological:     Mental Status: She is alert.  Psychiatric:        Behavior: Behavior is cooperative.      Diagnostic studies: none       Malachi Bonds, MD [facility]

## 2023-07-06 NOTE — Patient Instructions (Addendum)
 Intermittent nasal congestion - Because of insurance stipulations, we cannot do skin testing on the same day as your first visit. - We are all working to fight this, but for now we need to do two separate visits.  - We will know more after we do testing at the next visit.  - The skin testing visit can be squeezed in at your convenience.  - Then we can make a more full plan to address all of your symptoms. - Be sure to stop your antihistamines for 3 days before this appointment.   2. Insect sting allergy - We will do some testing to look for a stinging insect allergy.  - We can send in an EpiPen.     3. Return in about 1 week (around 07/13/2023). You can have the follow up appointment with Dr. Dellis Anes or a Nurse Practicioner (our Nurse Practitioners are excellent and always have Physician oversight!).    Please inform us of any Emergency Department visits, hospitalizations, or changes in symptoms. Call us before going to the ED for breathing or allergy symptoms since we might be able to fit you in for a sick visit. Feel free to contact us anytime with any questions, problems, or concerns.  It was a pleasure to meet you today!  Websites that have reliable patient information: 1. American Academy of Asthma, Allergy, and Immunology: www.aaaai.org 2. Food Allergy Research and Education (FARE): foodallergy.org 3. Mothers of Asthmatics: http://www.asthmacommunitynetwork.org 4. American College of Allergy, Asthma, and Immunology: www.acaai.org      "Like" [place] on Facebook and Instagram for our latest updates!      A healthy democracy works best when [employer] participate! Make sure you are registered to vote! If you have moved or changed any of your contact information, you will need to get this updated before voting! Scan the QR codes below to learn more!

## 2023-07-07 ENCOUNTER — Encounter: Payer: 59 | Admitting: Physical Therapy

## 2023-07-08 ENCOUNTER — Encounter: Payer: Self-pay | Admitting: Family Medicine

## 2023-07-21 ENCOUNTER — Ambulatory Visit: Payer: 59 | Admitting: Physical Therapy

## 2023-07-21 ENCOUNTER — Encounter: Payer: Self-pay | Admitting: Physical Therapy

## 2023-07-21 DIAGNOSIS — M6281 Muscle weakness (generalized): Secondary | ICD-10-CM

## 2023-07-21 DIAGNOSIS — M25562 Pain in left knee: Secondary | ICD-10-CM

## 2023-07-21 DIAGNOSIS — M25551 Pain in right hip: Secondary | ICD-10-CM | POA: Diagnosis not present
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
## 2023-07-27 ENCOUNTER — Encounter: Payer: Self-pay | Admitting: Family Medicine

## 2023-07-27 ENCOUNTER — Ambulatory Visit: Payer: 59 | Admitting: Allergy & Immunology

## 2023-07-27 DIAGNOSIS — M25551 Pain in right hip: Secondary | ICD-10-CM

## 2023-07-28 ENCOUNTER — Encounter: Payer: Self-pay | Admitting: Family Medicine

## 2023-08-05 ENCOUNTER — Ambulatory Visit
Admission: RE | Admit: 2023-08-05 | Discharge: 2023-08-05 | Disposition: A | Discharge status: Home / Self Care | Source: Ambulatory Visit | Attending: Family Medicine | Admitting: Family Medicine

## 2023-08-05 DIAGNOSIS — G8929 Other chronic pain: Secondary | ICD-10-CM | POA: Diagnosis not present

## 2023-08-05 DIAGNOSIS — M25551 Pain in right hip: Secondary | ICD-10-CM

## 2023-08-18 NOTE — Telephone Encounter (Signed)
 Jae Dire and Aundra Millet, Will you please reach out to radiology to expedite MRI review.   Thank you!

## 2023-08-19 ENCOUNTER — Encounter: Payer: Self-pay | Admitting: Family Medicine

## 2023-08-19 DIAGNOSIS — G8929 Other chronic pain: Secondary | ICD-10-CM

## 2023-08-19 NOTE — Progress Notes (Signed)
 Right hip MRI shows the possibility of a labrum tear.  This should cause pain or discomfort in the front of the hip or groin area.  At the iliac crest where you are having the most pain the radiologist did not think there was much wrong there.  Looking at it myself it is possible you have a little bit of peritendinitis but it does not look very impressive at all on the MRI.  If you would like reasonable to recheck to go over the pictures ourselves so you can see them to and talk about potential next steps.

## 2023-09-13 ENCOUNTER — Other Ambulatory Visit: Payer: Self-pay | Admitting: Family Medicine

## 2023-09-13 DIAGNOSIS — M5416 Radiculopathy, lumbar region: Secondary | ICD-10-CM

## 2023-09-13 MED ORDER — MELOXICAM 15 MG PO TABS: 15.0000 mg | ORAL_TABLET | Freq: Every day | ORAL | 0 refills | Status: AC | PRN

## 2023-09-13 NOTE — Telephone Encounter (Signed)
 MRI w/o CM at [facility].

## 2023-09-13 NOTE — Telephone Encounter (Signed)
 Is this okay to refill?

## 2023-09-13 NOTE — Telephone Encounter (Signed)
 Forwarding to Bri as FYI - MRI ordered to [facility].

## 2023-09-21 ENCOUNTER — Other Ambulatory Visit: Payer: Self-pay | Admitting: Family Medicine

## 2023-09-21 DIAGNOSIS — M5416 Radiculopathy, lumbar region: Secondary | ICD-10-CM

## 2023-10-15 ENCOUNTER — Telehealth: Payer: Self-pay | Admitting: Family Medicine

## 2023-10-15 NOTE — Telephone Encounter (Signed)
 Patient called and is following up regarding the MRI she had done at [facility]. She spoke with them and they said they have faxed it here. She said she has not heard from us . She says it was done on the 15th and read on the16th. She provided their phone number  [phone]) and says she spoke with rick. Please advise.

## 2023-10-18 NOTE — Telephone Encounter (Signed)
 I do not see that report has been scanned to pt chart.   Plan to reach out to [facility] at [phone] to f/u on report.

## 2023-10-18 NOTE — Telephone Encounter (Signed)
 Patient called to check in because she had not heard anything. I let her know we had requested that it be re-faxed and she was going to call and ask about it at [facility] as well. FYI

## 2023-10-18 NOTE — Telephone Encounter (Signed)
 MRI report received. Paper copy in Dr [employer] box and it is in Media as well.

## 2023-10-18 NOTE — Telephone Encounter (Signed)
 Report placed in Dr. Jola Nash in basket to review.

## 2023-10-18 NOTE — Telephone Encounter (Signed)
 Called [phone] and left VM requesting that MRI report be re-faxed.

## 2023-10-19 NOTE — Telephone Encounter (Signed)
 We have the MRI results back.  It shows:  Impression: Horizontal undersurface tear of the posterior horn of the medial meniscus with a small meniscal recess flap. Partial-thickness chondral fissuring along the medial patellar facet and median ridge. Low-grade chondrosis along the weightbearing surfaces of the medial compartment. Intact cruciate ligaments, collateral ligaments and lateral meniscus Date of read Oct 01, 2023.  Resolving symptoms scan.  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  You may be a good candidate for surgery consultation.  Would you like me to refer you to an orthopedic surgeon in this area that can do the surgery?

## 2023-10-29 DIAGNOSIS — M25551 Pain in right hip: Secondary | ICD-10-CM | POA: Diagnosis not present

## 2023-10-29 DIAGNOSIS — M25562 Pain in left knee: Secondary | ICD-10-CM | POA: Diagnosis not present

## 2023-11-09 DIAGNOSIS — M25551 Pain in right hip: Secondary | ICD-10-CM | POA: Diagnosis not present

## 2023-12-08 ENCOUNTER — Encounter: Payer: Self-pay | Admitting: Family Medicine

## 2024-02-08 DIAGNOSIS — L570 Actinic keratosis: Secondary | ICD-10-CM | POA: Diagnosis not present

## 2024-02-08 DIAGNOSIS — L814 Other melanin hyperpigmentation: Secondary | ICD-10-CM | POA: Diagnosis not present

## 2024-02-08 DIAGNOSIS — L720 Epidermal cyst: Secondary | ICD-10-CM | POA: Diagnosis not present

## 2024-02-08 DIAGNOSIS — L920 Granuloma annulare: Secondary | ICD-10-CM | POA: Diagnosis not present

## 2024-02-08 DIAGNOSIS — D485 Neoplasm of uncertain behavior of skin: Secondary | ICD-10-CM | POA: Diagnosis not present

## 2024-02-08 DIAGNOSIS — L821 Other seborrheic keratosis: Secondary | ICD-10-CM | POA: Diagnosis not present

## 2024-02-17 ENCOUNTER — Other Ambulatory Visit: Payer: Self-pay | Admitting: Obstetrics and Gynecology

## 2024-02-17 DIAGNOSIS — Z1231 Encounter for screening mammogram for malignant neoplasm of breast: Secondary | ICD-10-CM

## 2024-02-21 ENCOUNTER — Encounter: Payer: Self-pay | Admitting: Family Medicine

## 2024-02-21 NOTE — Telephone Encounter (Signed)
 We have that updated for you, thank you.

## 2024-03-20 ENCOUNTER — Encounter: Payer: Self-pay | Admitting: Radiology

## 2024-03-21 ENCOUNTER — Ambulatory Visit

## 2024-04-12 ENCOUNTER — Ambulatory Visit
Admission: RE | Admit: 2024-04-12 | Discharge: 2024-04-12 | Disposition: A | Discharge status: Home / Self Care | Source: Ambulatory Visit | Attending: Obstetrics and Gynecology | Admitting: Obstetrics and Gynecology

## 2024-04-12 DIAGNOSIS — Z1231 Encounter for screening mammogram for malignant neoplasm of breast: Secondary | ICD-10-CM | POA: Diagnosis not present

## 2024-04-18 ENCOUNTER — Other Ambulatory Visit: Payer: Self-pay

## 2024-04-18 ENCOUNTER — Encounter: Payer: Self-pay | Admitting: Allergy & Immunology

## 2024-04-18 ENCOUNTER — Ambulatory Visit: Payer: Self-pay | Admitting: Allergy & Immunology

## 2024-04-18 DIAGNOSIS — R0981 Nasal congestion: Secondary | ICD-10-CM | POA: Insufficient documentation

## 2024-04-18 DIAGNOSIS — J3089 Other allergic rhinitis: Secondary | ICD-10-CM | POA: Insufficient documentation

## 2024-04-18 DIAGNOSIS — T63481D Toxic effect of venom of other arthropod, accidental (unintentional), subsequent encounter: Secondary | ICD-10-CM | POA: Insufficient documentation

## 2024-04-18 MED ORDER — FAMOTIDINE 40 MG PO TABS: 40.0000 mg | ORAL_TABLET | Freq: Every day | ORAL | 0 refills | Status: DC

## 2024-04-18 MED ORDER — AZELASTINE HCL 0.1 % NA SOLN: 2.0000 | Freq: Every evening | NASAL | 5 refills | Status: DC

## 2024-04-18 NOTE — Progress Notes (Signed)
 FOLLOW UP  Date of Service/Encounter:  04/18/24   Assessment:   Insect sting allergy   Perennial allergic rhinitis (dust mites)  Nasal congestion   Plan/Recommendations:   Intermittent nasal congestion - Testing today showed: dust mites - Copy of test results provided.  - Avoidance measures provided. - Start taking: Astelin  (azelastine ) 2 sprays per nostril once daily at night - You can use an extra dose of the antihistamine, if needed, for breakthrough symptoms.  - Consider nasal saline rinses 1-2 times daily to remove allergens from the nasal cavities as well as help with mucous clearance (this is especially helpful to do before the nasal sprays are given) - Consider allergy  shots as a means of long-term control. - Allergy  shots re-train and reset the immune system to ignore environmental allergens and decrease the resulting immune response to those allergens (sneezing, itchy watery eyes, runny nose, nasal congestion, etc).    - Allergy  shots improve symptoms in 75-85% of patients.  - We can discuss more at the next appointment if the medications are not working for you. - I would like to start Pepcid  (famotidine ) 40mg  daily in case this is all related to uncontrolled reflux.  - Do this for one month to see if this helps.  - Testing was negative to the most common food allergies, ruling out more than 95% of all food allergies.   2. Insect sting allergy  - We will do some testing to look for a stinging insect allergy .  - We can send in an EpiPen.     3. Return in about 3 months (around 07/17/2024). You can have the follow up appointment with Dr. Iva or a Nurse Practicioner (our Nurse Practitioners are excellent and always have Physician oversight!).   Subjective:   Erin Weaver is a 58 y.o. female presenting today for follow up of  Chief Complaint  Patient presents with   Allergy  Testing    Erin Weaver has a history of the following: Patient Active  Problem List   Diagnosis Date Noted   Perennial allergic rhinitis 04/18/2024   Nasal congestion 04/18/2024   Insect sting allergy , current reaction, accidental or unintentional, subsequent encounter 04/18/2024   Generalized anxiety disorder 06/10/2023   Osteopenia 06/10/2023   Migraine without status migrainosus, not intractable 06/10/2023   Vitamin D  deficiency 06/10/2023   Palpitations 06/10/2023   Back pain with right-sided sciatica 06/01/2016   Elevated LFTs 03/23/2016   Acute on chronic cholecystitis s/p lap cholecystectomy 03/24/2016 03/23/2016   Choledocholithiasis 03/23/2016   Common bile duct (CBD) obstruction s/p ERCP 03/26/2016 03/23/2016    History obtained from: chart review and patient.  Discussed the use of AI scribe software for clinical note transcription with the patient and/or guardian, who gave verbal consent to proceed.  Erin Weaver is a 58 y.o. female presenting for skin testing. She was last seen on February 2025. We could not do testing because her insurance company does not cover testing on the same day as a New Patient visit. She has been off of all antihistamines 3 days in anticipation of the testing.   Otherwise, there have been no changes to her past medical history, surgical history, family history, or social history.    Review of systems otherwise negative other than that mentioned in the HPI.    Objective:   Last menstrual period 02/09/2017. There is no height or weight on file to calculate BMI.    Physical exam deferred since this was a skin testing appointment only.  Diagnostic studies:   Allergy Studies:     Airborne Adult Perc - 04/18/24 1356     Time Antigen Placed 1356    Allergen Manufacturer Jestine    Location Back    Number of Test 55    Panel 1 Select    1. Control-Buffer 50% Glycerol Negative    2. Control-Histamine 2+    3. Bahia Negative    4. Bermuda Negative    5. Johnson Negative    6. Kentucky  Blue Negative    7.  Meadow Fescue Negative    8. Perennial Rye Negative    9. Timothy Negative    10. Ragweed Mix Negative    11. Cocklebur Negative    12. Plantain,  English Negative    13. Baccharis Negative    14. Dog Fennel Negative    15. Russian Thistle Negative    16. Lamb's Quarters Negative    17. Sheep Sorrell Negative    18. Rough Pigweed Negative    19. Marsh Elder, Rough Negative    20. Mugwort, Common Negative    21. Box, Elder Negative    22. Cedar, red Negative    23. Sweet Gum Negative    24. Pecan Pollen Negative    25. Pine Mix Negative    26. Walnut, Black Pollen Negative    27. Red Mulberry Negative    28. Ash Mix Negative    29. Birch Mix Negative    30. Beech American Negative    31. Cottonwood, Eastern Negative    32. Hickory, White Negative    33. Maple Mix Negative    34. Oak, Eastern Mix Negative    35. Sycamore Eastern Negative    36. Alternaria Alternata Negative    37. Cladosporium Herbarum Negative    38. Aspergillus Mix Negative    39. Penicillium Mix Negative    40. Bipolaris Sorokiniana (Helminthosporium) Negative    41. Drechslera Spicifera (Curvularia) Negative    42. Mucor Plumbeus Negative    43. Fusarium Moniliforme Negative    44. Aureobasidium Pullulans (pullulara) Negative    45. Rhizopus Oryzae Negative    46. Botrytis Cinera Negative    47. Epicoccum Nigrum Negative    48. Phoma Betae Negative    49. Dust Mite Mix 3+    50. Cat Hair 10,000 BAU/ml Negative    51.  Dog Epithelia Negative    52. Mixed Feathers Negative    53. Horse Epithelia Negative    54. Cockroach, German Negative    55. Tobacco Leaf Negative          13 Food Perc - 04/18/24 1356       Test Information   Time Antigen Placed 1356    Allergen Manufacturer Jestine    Location Back    Number of allergen test 13    Food Select      Food   1. Peanut Negative    2. Soybean Negative    3. Wheat Negative    4. Sesame Negative    5. Milk, Cow Negative    6. Casein  Negative    7. Egg White, Chicken Negative    8. Shellfish Mix Negative    9. Fish Mix Negative    10. Cashew Negative    11. Walnut Food Negative    12. Almond Negative    13. Hazelnut Negative          Allergy testing results were read and interpreted by myself,  documented by clinical staff.      Marty Shaggy, MD  [facility] 

## 2024-04-18 NOTE — Patient Instructions (Addendum)
 Intermittent nasal congestion - Testing today showed: dust mites - Copy of test results provided.  - Avoidance measures provided. - Start taking: Astelin (azelastine) 2 sprays per nostril once daily at night - You can use an extra dose of the antihistamine, if needed, for breakthrough symptoms.  - Consider nasal saline rinses 1-2 times daily to remove allergens from the nasal cavities as well as help with mucous clearance (this is especially helpful to do before the nasal sprays are given) - Consider allergy shots as a means of long-term control. - Allergy shots re-train and reset the immune system to ignore environmental allergens and decrease the resulting immune response to those allergens (sneezing, itchy watery eyes, runny nose, nasal congestion, etc).    - Allergy shots improve symptoms in 75-85% of patients.  - We can discuss more at the next appointment if the medications are not working for you. - I would like to start Pepcid  (famotidine ) 40mg  daily in case this is all related to uncontrolled reflux.  - Do this for one month to see if this helps.  - Testing was negative to the most common food allergies, ruling out more than 95% of all food allergies.   2. Insect sting allergy - We will do some testing to look for a stinging insect allergy.  - We can send in an EpiPen.     3. Return in about 3 months (around 07/17/2024). You can have the follow up appointment with Dr. Iva or a Nurse Practicioner (our Nurse Practitioners are excellent and always have Physician oversight!).    Please inform us  of any Emergency Department visits, hospitalizations, or changes in symptoms. Call us  before going to the ED for breathing or allergy symptoms since we might be able to fit you in for a sick visit. Feel free to contact us  anytime with any questions, problems, or concerns.  It was a pleasure to meet you today!  Websites that have reliable patient information: 1. American Academy of  Asthma, Allergy, and Immunology: www.aaaai.org 2. Food Allergy Research and Education (FARE): foodallergy.org 3. Mothers of Asthmatics: http://www.asthmacommunitynetwork.org 4. American College of Allergy, Asthma, and Immunology: www.acaai.org      "Like" us  on Facebook and Instagram for our latest updates!      A healthy democracy works best when [employer] participate! Make sure you are registered to vote! If you have moved or changed any of your contact information, you will need to get this updated before voting! Scan the QR codes below to learn more!      Airborne Adult Perc - 04/18/24 1356     Time Antigen Placed 1356    Allergen Manufacturer Jestine    Location Back    Number of Test 55    Panel 1 Select    1. Control-Buffer 50% Glycerol Negative    2. Control-Histamine 2+    3. Bahia Negative    4. Bermuda Negative    5. Johnson Negative    6. [place]  Blue Negative    7. Meadow Fescue Negative    8. Perennial Rye Negative    9. Timothy Negative    10. Ragweed Mix Negative    11. Cocklebur Negative    12. Plantain,  English Negative    13. Baccharis Negative    14. Dog Fennel Negative    15. Russian Thistle Negative    16. Lamb's Quarters Negative    17. Sheep Sorrell Negative    18. Rough Pigweed Negative  19. Marsh Elder, Rough Negative    20. Mugwort, Common Negative    21. Box, Elder Negative    22. Cedar, red Negative    23. Sweet Gum Negative    24. Pecan Pollen Negative    25. Pine Mix Negative    26. Walnut, Black Pollen Negative    27. Red Mulberry Negative    28. Ash Mix Negative    29. Birch Mix Negative    30. Beech American Negative    31. Cottonwood, Eastern Negative    32. Hickory, White Negative    33. Maple Mix Negative    34. Oak, Eastern Mix Negative    35. Sycamore Eastern Negative    36. Alternaria Alternata Negative    37. Cladosporium Herbarum Negative    38. Aspergillus Mix Negative    39. Penicillium Mix Negative    40.  Bipolaris Sorokiniana (Helminthosporium) Negative    41. Drechslera Spicifera (Curvularia) Negative    42. Mucor Plumbeus Negative    43. Fusarium Moniliforme Negative    44. Aureobasidium Pullulans (pullulara) Negative    45. Rhizopus Oryzae Negative    46. Botrytis Cinera Negative    47. Epicoccum Nigrum Negative    48. Phoma Betae Negative    49. Dust Mite Mix 3+    50. Cat Hair 10,000 BAU/ml Negative    51.  Dog Epithelia Negative    52. Mixed Feathers Negative    53. Horse Epithelia Negative    54. Cockroach, German Negative    55. Tobacco Leaf Negative          13 Food Perc - 04/18/24 1356       Test Information   Time Antigen Placed 1356    Allergen Manufacturer Jestine    Location Back    Number of allergen test 13    Food Select      Food   1. Peanut Negative    2. Soybean Negative    3. Wheat Negative    4. Sesame Negative    5. Milk, Cow Negative    6. Casein Negative    7. Egg White, Chicken Negative    8. Shellfish Mix Negative    9. Fish Mix Negative    10. Cashew Negative    11. Walnut Food Negative    12. Almond Negative    13. Hazelnut Negative          Control of Dust Mite Allergen    Dust mites play a major role in allergic asthma and rhinitis.  They occur in environments with high humidity wherever human skin is found.  Dust mites absorb humidity from the atmosphere (ie, they do not drink) and feed on organic matter (including shed human and animal skin).  Dust mites are a microscopic type of insect that you cannot see with the naked eye.  High levels of dust mites have been detected from mattresses, pillows, carpets, upholstered furniture, bed covers, clothes, soft toys and any woven material.  The principal allergen of the dust mite is found in its feces.  A gram of dust may contain 1,000 mites and 250,000 fecal particles.  Mite antigen is easily measured in the air during house cleaning activities.  Dust mites do not bite and do not cause harm  to humans, other than by triggering allergies/asthma.    Ways to decrease your exposure to dust mites in your home:  Encase mattresses, box springs and pillows with a mite-impermeable barrier or cover  Wash sheets, blankets and drapes weekly in hot water (130 F) with detergent and dry them in a dryer on the hot setting.  Have the room cleaned frequently with a vacuum cleaner and a damp dust-mop.  For carpeting or rugs, vacuuming with a vacuum cleaner equipped with a high-efficiency particulate air (HEPA) filter.  The dust mite allergic individual should not be in a room which is being cleaned and should wait 1 hour after cleaning before going into the room. Do not sleep on upholstered furniture (eg, couches).   If possible removing carpeting, upholstered furniture and drapery from the home is ideal.  Horizontal blinds should be eliminated in the rooms where the person spends the most time (bedroom, study, television room).  Washable vinyl, roller-type shades are optimal. Remove all non-washable stuffed toys from the bedroom.  Wash stuffed toys weekly like sheets and blankets above.   Reduce indoor humidity to less than 50%.  Inexpensive humidity monitors can be purchased at most hardware stores.  Do not use a humidifier as can make the problem worse and are not recommended.

## 2024-04-20 DIAGNOSIS — X58XXXA Exposure to other specified factors, initial encounter: Secondary | ICD-10-CM | POA: Diagnosis not present

## 2024-04-20 DIAGNOSIS — G8918 Other acute postprocedural pain: Secondary | ICD-10-CM | POA: Diagnosis not present

## 2024-04-20 DIAGNOSIS — Y999 Unspecified external cause status: Secondary | ICD-10-CM | POA: Diagnosis not present

## 2024-04-20 DIAGNOSIS — M2242 Chondromalacia patellae, left knee: Secondary | ICD-10-CM | POA: Diagnosis not present

## 2024-04-20 DIAGNOSIS — S83232A Complex tear of medial meniscus, current injury, left knee, initial encounter: Secondary | ICD-10-CM | POA: Diagnosis not present

## 2024-04-20 HISTORY — PX: KNEE ARTHROSCOPY: SHX127

## 2024-05-30 ENCOUNTER — Encounter: Payer: Self-pay | Admitting: Family Medicine

## 2024-05-30 NOTE — Progress Notes (Unsigned)
 No chief complaint on file.   Patient presents with complaint of crackling in her ear  She denies any congestion or recent illness. She has been having more headaches, including a severe migraine last week.  She eventually took Nurtec in the evening (after suffering all day), and was better the next morning.   PMH, PSH, SH reviewed and updated  S/p L knee arthoscopy 04/2024 with Dr. Dalldorf. She has seen allergist, testing revealed allergy  to dust mites. No food allergies. Prescribed Astelin . Labs ordered for alpha-gal and hymenoptera venom, not done.  Rx'd epi-pen. Allergist recommended 1 month trial of famotidine  40mg  daily, and f/u 07/2024.  Under the care of Dr. Cydne on HRT, and lexapro     ROS:    PHYSICAL EXAM:  LMP 02/09/2017   Wt Readings from Last 3 Encounters:  07/06/23 111 lb 8 oz (50.6 kg)  07/05/23 112 lb 6.4 oz (51 kg)  06/09/23 110 lb 12.8 oz (50.3 kg)       ASSESSMENT/PLAN:  She had capvaxive from the pharmacy--does that show up?  I see the flu/COVID entered

## 2024-05-31 ENCOUNTER — Encounter: Payer: Self-pay | Admitting: Family Medicine

## 2024-05-31 ENCOUNTER — Ambulatory Visit (INDEPENDENT_AMBULATORY_CARE_PROVIDER_SITE_OTHER): Payer: PRIVATE HEALTH INSURANCE | Admitting: Family Medicine

## 2024-05-31 VITALS — BP 110/64 | HR 60 | Ht 62.0 in | Wt 112.2 lb

## 2024-05-31 DIAGNOSIS — H938X2 Other specified disorders of left ear: Secondary | ICD-10-CM | POA: Diagnosis not present

## 2024-05-31 DIAGNOSIS — F411 Generalized anxiety disorder: Secondary | ICD-10-CM | POA: Diagnosis not present

## 2024-05-31 DIAGNOSIS — G43909 Migraine, unspecified, not intractable, without status migrainosus: Secondary | ICD-10-CM | POA: Diagnosis not present

## 2024-05-31 DIAGNOSIS — R112 Nausea with vomiting, unspecified: Secondary | ICD-10-CM

## 2024-05-31 DIAGNOSIS — J3089 Other allergic rhinitis: Secondary | ICD-10-CM

## 2024-05-31 MED ORDER — ONDANSETRON 4 MG PO TBDP: 4.0000 mg | ORAL_TABLET | Freq: Three times a day (TID) | ORAL | 0 refills | Status: AC | PRN

## 2024-05-31 MED ORDER — NURTEC 75 MG PO TBDP: 1.0000 | ORAL_TABLET | Freq: Every day | ORAL | 0 refills | Status: AC | PRN

## 2024-05-31 NOTE — Patient Instructions (Addendum)
 We discussed your last allergy  visit-- They had ordered lab tests to evaluate for one additional allergy  (Alphagal) and also for bee sting allergy .  You can check with them regarding getting these, if desired (they ordered these labs). They also suggested famotidine  40 mg (pepcid ) once daily for a month, just to rule out any reflux contributing to your throat symptoms.  Use the ondansetron  when you feel very nauseated (related to migraine or not).  You have some wax in the left ear that might be moving and contributing to your symptoms. But you also have congestion more on the left, which is likely causing some eustachian tube dysfunction.  Try using the astelin  nasal spray twice daily (or flonase once daily) and see if this helps. We can remove the wax in the ear if this doesn't improve.  I don't see anything to suggest TMJ. I do encourage you to take the lexapro  daily for anxiety (which can lead to headaches too).  You may also want to schedule a routine eye exam, as eye strain might be contributing to headaches.

## 2024-06-01 ENCOUNTER — Ambulatory Visit: Payer: Self-pay | Admitting: Family Medicine

## 2024-06-01 ENCOUNTER — Encounter: Payer: Self-pay | Admitting: Family Medicine

## 2024-06-08 ENCOUNTER — Encounter: Payer: Self-pay | Admitting: Family Medicine
